# Patient Record
Sex: Male | Born: 1955 | Race: White | Hispanic: No | Marital: Single | Smoking: Current every day smoker
Health system: Southern US, Community
[De-identification: ages and names within clinical notes are randomized; demographics above are authoritative.]

## PROBLEM LIST (undated history)

## (undated) DIAGNOSIS — Z8611 Personal history of tuberculosis: Secondary | ICD-10-CM

## (undated) DIAGNOSIS — J449 Chronic obstructive pulmonary disease, unspecified: Secondary | ICD-10-CM

## (undated) DIAGNOSIS — I10 Essential (primary) hypertension: Secondary | ICD-10-CM

## (undated) DIAGNOSIS — F200 Paranoid schizophrenia: Secondary | ICD-10-CM

## (undated) DIAGNOSIS — E785 Hyperlipidemia, unspecified: Secondary | ICD-10-CM

## (undated) DIAGNOSIS — K219 Gastro-esophageal reflux disease without esophagitis: Secondary | ICD-10-CM

## (undated) DIAGNOSIS — F329 Major depressive disorder, single episode, unspecified: Secondary | ICD-10-CM

---

## 2008-07-05 ENCOUNTER — Ambulatory Visit: Payer: Self-pay | Admitting: Gastroenterology

## 2008-07-12 ENCOUNTER — Ambulatory Visit (HOSPITAL_COMMUNITY): Admission: RE | Admit: 2008-07-12 | Discharge: 2008-07-12 | Payer: Self-pay | Admitting: Gastroenterology

## 2008-07-12 ENCOUNTER — Encounter: Payer: Self-pay | Admitting: Gastroenterology

## 2008-07-12 ENCOUNTER — Ambulatory Visit: Payer: Self-pay | Admitting: Gastroenterology

## 2008-07-31 ENCOUNTER — Encounter: Payer: Self-pay | Admitting: Gastroenterology

## 2008-09-13 DIAGNOSIS — K625 Hemorrhage of anus and rectum: Secondary | ICD-10-CM

## 2008-09-13 DIAGNOSIS — I10 Essential (primary) hypertension: Secondary | ICD-10-CM | POA: Insufficient documentation

## 2008-09-13 DIAGNOSIS — E785 Hyperlipidemia, unspecified: Secondary | ICD-10-CM | POA: Insufficient documentation

## 2008-09-14 ENCOUNTER — Ambulatory Visit: Payer: Self-pay | Admitting: Gastroenterology

## 2008-09-14 DIAGNOSIS — K649 Unspecified hemorrhoids: Secondary | ICD-10-CM | POA: Insufficient documentation
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
## 2010-09-30 NOTE — Op Note (Signed)
NAME:  Peter Harris, Peter Harris                ACCOUNT NO.:  [id number]   MEDICAL RECORD NO.:  [id number]          PATIENT TYPE:  AMB   LOCATION:  DAY                           FACILITY:  [facility]   PHYSICIAN:  Kassie Mends, M.D.      DATE OF BIRTH:  1956/05/11   DATE OF PROCEDURE:  07/12/2008  DATE OF DISCHARGE:                               OPERATIVE REPORT   REFERRING PHYSICIAN:  Santiago Bur. Shelva Majestic MD, [place], [place].   PRIMARY PHYSICIAN:  Reynolds Bowl, MD   PROCEDURE:  Colonoscopy with cold forceps biopsy of the anal canal and  cold forceps polypectomy.   INDICATION FOR EXAMINATION:  Peter Harris is a 55 year old man who presents  with rectal bleeding.   FINDINGS:  1. A 3-mm sessile hepatic flexure polyp removed via cold forceps.      Otherwise, no evidence of masses, inflammatory changes,      diverticula, or AVMs.  2. Extremely redundant sigmoid colon and tortuous colon.  It made      advancing the scope through the sigmoid colon into the cecum      challenging.  3. Retroflexed view of the rectum was performed and diagnostic      gastroscope because the view could not be obtained with the adult      colonoscope.  The retroflexed view of the rectum revealed an anal      canal lesion.  This is most likely hemorrhoid.  Biopsy was obtained      via cold forceps.  No rectal polyps.   DIAGNOSES:  1. Small hepatic flexure polyp.  2. Anal canal lesion likely secondary to prolapsed internal      hemorrhoids.   RECOMMENDATIONS:  1. Screening colonoscopy in 10 years if he has a simple adenoma.  2. We will call Peter Harris with results of his biopsies.  Add Anusol HC      1 per rectum twice daily for 14 days.  3. He should avoid aspirin or any anti-inflammatory drugs for 21 days.      No anticoagulation for 7 days.  4. He should follow high-fiber diet.  He is given a handout on high-      fiber diet, polyps, and hemorrhoids.   MEDICATIONS:  1. Demerol 100 mg IV.  2. Versed 4 mg IV.   PROCEDURE TECHNIQUE:  Physical exam was performed.  Informed consent was  obtained from the patient after explaining the benefits, risks, and  alternatives to the procedure.  The patient was connected to the monitor  and placed in left lateral position.  Continuous oxygen was provided by  nasal cannula.  IV medicine administered through an indwelling cannula.  After administration of sedation and rectal exam, the patient's rectum  was intubated and the scope was advanced under direct visualization to  the cecum.  The scope was removed slowly by carefully  examining the color, texture, anatomy, and integrity of the mucosa on  the way out.  The patient was recovered in endoscopy and discharged home  in satisfactory condition.   PATH:  Benign colonic mucosa with lymphoid  aggregates. Benign anal canal  lesion.      Kassie Mends, M.D.  Electronically Signed     SM/MEDQ  D:  07/12/2008  T:  07/13/2008  Job:  [id number]   cc:   Reynolds Bowl, MD

## 2018-05-10 ENCOUNTER — Encounter: Payer: Self-pay | Admitting: Gastroenterology

## 2020-12-05 ENCOUNTER — Encounter: Payer: Self-pay | Admitting: Internal Medicine

## 2020-12-05 ENCOUNTER — Ambulatory Visit (HOSPITAL_COMMUNITY)
Admission: RE | Admit: 2020-12-05 | Discharge: 2020-12-05 | Disposition: A | Discharge status: Home / Self Care | Payer: Medicare Other | Source: Ambulatory Visit | Attending: Internal Medicine | Admitting: Internal Medicine

## 2020-12-05 ENCOUNTER — Other Ambulatory Visit: Payer: Self-pay

## 2020-12-05 ENCOUNTER — Ambulatory Visit (INDEPENDENT_AMBULATORY_CARE_PROVIDER_SITE_OTHER): Payer: Medicare Other | Admitting: Internal Medicine

## 2020-12-05 DIAGNOSIS — F17210 Nicotine dependence, cigarettes, uncomplicated: Secondary | ICD-10-CM | POA: Diagnosis not present

## 2020-12-05 DIAGNOSIS — J449 Chronic obstructive pulmonary disease, unspecified: Secondary | ICD-10-CM | POA: Insufficient documentation

## 2020-12-05 MED ORDER — ANORO ELLIPTA 62.5-25 MCG/INH IN AEPB: INHALATION_SPRAY | RESPIRATORY_TRACT | 11 refills | Status: DC

## 2020-12-05 MED ORDER — ANORO ELLIPTA 62.5-25 MCG/INH IN AEPB: 1.0000 | INHALATION_SPRAY | Freq: Every day | RESPIRATORY_TRACT | 0 refills | Status: DC

## 2020-12-05 NOTE — Patient Instructions (Addendum)
Try off spiriva and on Anoro one click first thing each am   Only use your albuterol as a rescue medication to be used if you can't catch your breath by resting or doing a relaxed purse lip breathing pattern.  - The less you use it, the better it will work when you need it. - Ok to use up to 2 puffs  every 4 hours if you must but call for immediate appointment if use goes up over your usual need - Don't leave home without it !!  (think of it like starter fluid for an engine)   If doing better on samples of Anoro, ok to fill the prescription given today  The key is to stop smoking completely before smoking completely stops you!    Please remember to go to the  x-ray department  @  [facility] for your tests - we will call you with the results when they are available     Please schedule a follow up visit in 3 months  with pfts on return - try not to use the  inhalers than morning but bring them with you

## 2020-12-05 NOTE — Assessment & Plan Note (Addendum)
Onset prior to 2020  -  12/05/2020   Walked RA  fast pace for 3 laps without stopping. He did not complain of shortness of breath or trouble breathing during the walk with sats 98% at end. - 12/05/2020  After extensive coaching inhaler device,  effectiveness =    90%  DPI  > change spiriva to anoro daily   Pt is Group B in terms of symptom/risk and laba/lama therefore appropriate rx at this point >>>  anoro plus appropriate saba  Res saba: I spent extra time with pt today reviewing appropriate use of albuterol for prn use on exertion with the following points: 1) saba is for relief of sob that does not improve by walking a slower pace or resting but rather if the pt does not improve after trying this first. 2) If the pt is convinced, as many are, that saba helps recover from activity faster then it's easy to tell if this is the case by re-challenging : ie stop, take the inhaler, then p 5 minutes try the exact same activity (intensity of workload) that just caused the symptoms and see if they are substantially diminished or not after saba 3) if there is an activity that reproducibly causes the symptoms, try the saba 15 min before the activity on alternate days   If in fact the saba really does help, then fine to continue to use it prn but advised may need to look closer at the maintenance regimen being used to achieve better control of airways disease with exertion.     Each maintenance medication was reviewed in detail including emphasizing most importantly the difference between maintenance and prns and under what circumstances the prns are to be triggered using an action plan format where appropriate.  Total time for H and P, chart review, counseling, reviewing elipta device(s) , directly observing portions of ambulatory 02 saturation study/ and generating customized AVS unique to this office visit / same day charting = 54 min

## 2020-12-05 NOTE — Progress Notes (Signed)
Peter Harris, male    DOB: [birth year] MRN: [id number]   Brief patient profile:  65 yo  male active smoker who lives in group home referred to pulmonary clinic in [place]  12/05/2020 by   Anselm Jungling NP for doe x 48ft end of driveway level onset spring 2022 though placed on spiriva and albuterol since at least 2020 (not really sure)      History of Present Illness  12/05/2020  Pulmonary/ 1st office eval/ Demarquez Ciolek / [place] Office  Chief Complaint  Patient presents with   Consult    Patient reports that he has had short of breath for years and worse in last 6 months.   Dyspnea:  baseline not very active but able to walk a mall  Cough: none  Sleep: no problem on side / flat bed  SABA use: saba first thing in am / occ in pm  No obvious day to day or daytime variability or assoc excess/ purulent sputum or mucus plugs or hemoptysis or cp or chest tightness, subjective wheeze or overt sinus or hb symptoms.   Sleeping  without nocturnal  or early am exacerbation  of respiratory  c/o's or need for noct saba. Also denies any obvious fluctuation of symptoms with weather or environmental changes or other aggravating or alleviating factors except as outlined above   No unusual exposure hx or h/o childhood pna/ asthma or knowledge of premature birth.  Current Allergies, Complete Past Medical History, Past Surgical History, Family History, and Social History were reviewed in [employer] record.  ROS  The following are not active complaints unless bolded Hoarseness, sore throat, dysphagia, dental problems, itching, sneezing,  nasal congestion or discharge of excess mucus or purulent secretions, ear ache,   fever, chills, sweats, unintended wt loss or wt gain, classically pleuritic or exertional cp,  orthopnea pnd or arm/hand swelling  or leg swelling, presyncope, palpitations, abdominal pain, anorexia, nausea, vomiting, diarrhea  or change in bowel habits or change in  bladder habits, change in stools or change in urine, dysuria, hematuria,  rash, arthralgias, visual complaints, headache, numbness, weakness or ataxia or problems with walking or coordination,  change in mood or  memory.           No past medical history on file.  Outpatient Medications Prior to Visit  Medication Sig Dispense Refill   albuterol (VENTOLIN HFA) 108 (90 Base) MCG/ACT inhaler Inhale 1 puff into the lungs.     aspirin 81 MG EC tablet Take 1 tablet by mouth daily.     hydrALAZINE (APRESOLINE) 10 MG tablet Take 10 mg by mouth 3 (three) times daily.     hydrOXYzine (ATARAX/VISTARIL) 50 MG tablet Take 1 tablet by mouth at bedtime.     loratadine (CLARITIN) 10 MG tablet Take 1 tablet by mouth daily.     lovastatin (MEVACOR) 40 MG tablet Take 2 tablets by mouth at bedtime.     metoprolol (TOPROL-XL) 200 MG 24 hr tablet Take 200 mg by mouth daily.     Multiple Vitamin (MULTI-VITAMIN) tablet Take 1 tablet by mouth daily.     OLANZapine (ZYPREXA) 20 MG tablet Take 20 mg by mouth daily.     omeprazole (PRILOSEC) 40 MG capsule Take 40 mg by mouth daily.     sertraline (ZOLOFT) 25 MG tablet Take 25 mg by mouth daily.     triamterene-hydrochlorothiazide (MAXZIDE) 75-50 MG tablet Take 1 tablet by mouth daily.     SPIRIVA HANDIHALER 18  MCG inhalation capsule 1 capsule daily.     No facility-administered medications prior to visit.     Objective:     BP 118/74 (BP Location: Left Arm, Patient Position: Sitting, Cuff Size: Normal)   Pulse 75   Temp 97.7 F (36.5 C) (Oral)   Ht 5\' 9"  (1.753 m)   Wt 136 lb (61.7 kg)   SpO2 98% Comment: RA  BMI 20.08 kg/m   SpO2: 98 % (RA)  Pleasant very thin amb bm nad   HEENT : pt wearing mask not removed for exam due to covid - 19 concerns.    NECK :  without JVD/Nodes/TM/ nl carotid upstrokes bilaterally   LUNGS: no acc muscle use,  Mild barrel  contour chest wall with bilateral  Distant bs s audible wheeze and  without cough on insp or  exp maneuvers  and mild  Hyperresonant  to  percussion bilaterally     CV:  RRR  no s3 or murmur or increase in P2, and no edema   ABD:  soft and nontender with pos end  insp Hoover's  in the supine position. No bruits or organomegaly appreciated, bowel sounds nl  MS:   Nl gait/  ext warm without deformities, calf tenderness, cyanosis or clubbing No obvious joint restrictions   SKIN: warm and dry without lesions    NEURO:  alert, approp, nl sensorium with  no motor or cerebellar deficits apparent.        CXR PA and Lateral:   12/05/2020 :    I personally reviewed images and agree with radiology impression as follows:    1.  No radiographic evidence of acute cardiopulmonary disease. 2. Emphysema.     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 He did not complain of shortness of breath or trouble breathing during the walk with sats 98% at end. - 12/05/2020  After extensive coaching inhaler device,  effectiveness =    90%  DPI  > change spiriva to anoro daily   Pt is Group B in terms of symptom/risk and laba/lama therefore appropriate rx at this point >>>  anoro plus appropriate saba  Res saba: I spent extra time with pt today reviewing appropriate use of albuterol for prn use on exertion with the following points: 1) saba is for relief of sob that does not improve by walking a slower pace or resting but rather if the pt does not improve after trying this first. 2) If the pt is convinced, as many are, that saba helps recover from activity faster then it's easy to tell if this is the case by re-challenging : ie stop, take the inhaler, then p 5 minutes try the exact same activity (intensity of workload) that just caused the symptoms and see if they are substantially diminished or not after saba 3) if there is an activity that reproducibly causes the symptoms, try the saba 15 min before the activity on  alternate days   If in fact the saba really does help, then fine to continue to use it prn but advised may need to look closer at the maintenance regimen being used to achieve better control of airways disease with exertion.     Each maintenance medication was reviewed in detail including emphasizing most importantly the difference between maintenance and prns and under what circumstances the prns are to be  triggered using an action plan format where appropriate.  Total time for H and P, chart review, counseling, reviewing elipta device(s) , directly observing portions of ambulatory 02 saturation study/ and generating customized AVS unique to this office visit / same day charting = 54 min                  Cigarette smoker 4-5 min discussion re active cigarette smoking in addition to office E&M  Ask about tobacco use:  Ongoing Advise quitting   I took an extended  opportunity with this patient to outline the consequences of continued cigarette use  in airway disorders based on all the data we have from the multiple national lung health studies (perfomed over decades at millions of dollars in cost)  indicating that smoking cessation, not choice of inhalers or physicians, is the most important aspect of his care.  Assess willingness:  Not committed at this point Assist in quit attempt:  Per PCP when ready Arrange follow up:   Follow up per Primary Care planned  For smoking cessation classes call [phone]                                 Sandrea Hughs, MD 12/05/2020

## 2020-12-05 NOTE — Assessment & Plan Note (Addendum)
4-5 min discussion re active cigarette smoking in addition to office E&M  Ask about tobacco use:   Ongoing  Advise quitting   I took an extended  opportunity with this patient to outline the consequences of continued cigarette use  in airway disorders based on all the data we have from the multiple national lung health studies (perfomed over decades at millions of dollars in cost)  indicating that smoking cessation, not choice of inhalers or physicians, is the most important aspect of his care.   Assess willingness:  Not committed at this point Assist in quit attempt:  Per PCP when ready Arrange follow up:   Follow up per Primary Care planned  For smoking cessation classes call [phone]      

## 2020-12-13 ENCOUNTER — Telehealth: Payer: Self-pay | Admitting: Internal Medicine

## 2020-12-13 NOTE — Telephone Encounter (Signed)
ATC Barbara about inhaler for patient, [facility]

## 2020-12-13 NOTE — Addendum Note (Signed)
Addended by: Pilar Grammes on: 12/13/2020 10:14 AM   Modules accepted: Orders

## 2020-12-16 NOTE — Telephone Encounter (Signed)
Called and spoke with Britta Mccreedy who states that patient was given Anoro at last visit and would like to switch to that inhaler. Britta Mccreedy states that she was given a prescription for it. Advised her to take it to pharmacy and they should be able to fill it for her and if there is any issues for her to call and let us know. She stated he is also scheduled for his PFT and then will come back in October. Nothing further needed at this time.

## 2020-12-16 NOTE — Telephone Encounter (Signed)
Peter Harris from [facility] returning call.  [phone].

## 2021-02-14 ENCOUNTER — Other Ambulatory Visit (HOSPITAL_COMMUNITY)
Admission: RE | Admit: 2021-02-14 | Discharge: 2021-02-14 | Disposition: A | Discharge status: Home / Self Care | Payer: Medicare Other | Source: Ambulatory Visit | Attending: Internal Medicine | Admitting: Internal Medicine

## 2021-02-18 ENCOUNTER — Ambulatory Visit (HOSPITAL_COMMUNITY): Payer: Medicare Other

## 2021-02-27 ENCOUNTER — Ambulatory Visit: Payer: Medicare Other | Admitting: Internal Medicine

## 2021-02-27 NOTE — Progress Notes (Deleted)
Peter Harris, male    DOB: 02-03-1956 MRN: [id number]   Brief patient profile:  65 yo  male active smoker who lives in group home referred to pulmonary clinic in [place]  12/05/2020 by   Anselm Jungling NP for doe x 41ft end of driveway level onset spring 2022 though placed on spiriva and albuterol since at least 2020 (not really sure)      History of Present Illness  12/05/2020  Pulmonary/ 1st office eval/ Latrica Clowers / [place] Office  Chief Complaint  Patient presents with   Consult    Patient reports that he has had short of breath for years and worse in last 6 months.   Dyspnea:  baseline not very active but able to walk a mall  Cough: none  Sleep: no problem on side / flat bed  SABA use: saba first thing in am / occ in pm Rec Try off spiriva and on Anoro one click first thing each am  Only use your albuterol as a rescue medication If doing better on samples of Anoro, ok to fill the prescription given today The key is to stop smoking completely before smoking completely stops you!  Please schedule a follow up visit in 3 months  with pfts on return - try not to use the  inhalers than morning but bring them with you   02/27/2021  f/u ov/[place] office/Korie Brabson re: copd gold ?  maint on ***  No chief complaint on file.   Dyspnea:  *** Cough: *** Sleeping: *** SABA use: *** 02: *** Covid status: *** Lung cancer screening: ***   No obvious day to day or daytime variability or assoc excess/ purulent sputum or mucus plugs or hemoptysis or cp or chest tightness, subjective wheeze or overt sinus or hb symptoms.   *** without nocturnal  or early am exacerbation  of respiratory  c/o's or need for noct saba. Also denies any obvious fluctuation of symptoms with weather or environmental changes or other aggravating or alleviating factors except as outlined above   No unusual exposure hx or h/o childhood pna/ asthma or knowledge of premature birth.  Current Allergies, Complete Past  Medical History, Past Surgical History, Family History, and Social History were reviewed in [employer] record.  ROS  The following are not active complaints unless bolded Hoarseness, sore throat, dysphagia, dental problems, itching, sneezing,  nasal congestion or discharge of excess mucus or purulent secretions, ear ache,   fever, chills, sweats, unintended wt loss or wt gain, classically pleuritic or exertional cp,  orthopnea pnd or arm/hand swelling  or leg swelling, presyncope, palpitations, abdominal pain, anorexia, nausea, vomiting, diarrhea  or change in bowel habits or change in bladder habits, change in stools or change in urine, dysuria, hematuria,  rash, arthralgias, visual complaints, headache, numbness, weakness or ataxia or problems with walking or coordination,  change in mood or  memory.        No outpatient medications have been marked as taking for the 02/27/21 encounter (Appointment) with Nyoka Cowden, MD.            Objective:     Wt Readings from Last 3 Encounters:  12/05/20 136 lb (61.7 kg)      Vital signs reviewed  02/27/2021  - Note at rest 02 sats  ***% on ***   General appearance:    ***        Mild bar***     I personally reviewed images and agree  with radiology impression as follows:  CXR:   12/05/20 pa and lat C/w emphysema     Assessment

## 2021-03-18 DEATH — deceased

## 2021-03-21 ENCOUNTER — Other Ambulatory Visit (HOSPITAL_COMMUNITY)
Admission: RE | Admit: 2021-03-21 | Discharge: 2021-03-21 | Disposition: A | Discharge status: Home / Self Care | Payer: Medicare Other | Source: Ambulatory Visit | Attending: Internal Medicine | Admitting: Internal Medicine

## 2021-03-21 DIAGNOSIS — Z01818 Encounter for other preprocedural examination: Secondary | ICD-10-CM

## 2021-03-21 DIAGNOSIS — Z01812 Encounter for preprocedural laboratory examination: Secondary | ICD-10-CM | POA: Insufficient documentation

## 2021-03-21 DIAGNOSIS — Z20822 Contact with and (suspected) exposure to covid-19: Secondary | ICD-10-CM | POA: Diagnosis not present

## 2021-03-21 LAB — SARS CORONAVIRUS 2 (TAT 6-24 HRS): SARS Coronavirus 2: NEGATIVE

## 2021-03-25 ENCOUNTER — Ambulatory Visit (HOSPITAL_COMMUNITY)
Admission: RE | Admit: 2021-03-25 | Discharge: 2021-03-25 | Disposition: A | Discharge status: Home / Self Care | Payer: Medicare Other | Source: Ambulatory Visit | Attending: Internal Medicine | Admitting: Internal Medicine

## 2021-03-25 ENCOUNTER — Other Ambulatory Visit: Payer: Self-pay

## 2021-03-25 DIAGNOSIS — J449 Chronic obstructive pulmonary disease, unspecified: Secondary | ICD-10-CM | POA: Diagnosis present

## 2021-03-25 LAB — PULMONARY FUNCTION TEST
DL/VA % pred: 31 %
DL/VA: 1.3 ml/min/mmHg/L
DLCO unc % pred: 22 %
DLCO unc: 6.27 ml/min/mmHg
FEF 25-75 Post: 0.42 L/sec
FEF 25-75 Pre: 0.49 L/sec
FEF2575-%Change-Post: -13 %
FEF2575-%Pred-Post: 15 %
FEF2575-%Pred-Pre: 17 %
FEV1-%Change-Post: -5 %
FEV1-%Pred-Post: 34 %
FEV1-%Pred-Pre: 36 %
FEV1-Post: 1.22 L
FEV1-Pre: 1.3 L
FEV1FVC-%Change-Post: -8 %
FEV1FVC-%Pred-Pre: 71 %
FEV6-%Change-Post: 0 %
FEV6-%Pred-Post: 51 %
FEV6-%Pred-Pre: 51 %
FEV6-Post: 2.31 L
FEV6-Pre: 2.3 L
FEV6FVC-%Change-Post: -2 %
FEV6FVC-%Pred-Post: 97 %
FEV6FVC-%Pred-Pre: 99 %
FVC-%Change-Post: 2 %
FVC-%Pred-Post: 52 %
FVC-%Pred-Pre: 51 %
FVC-Post: 2.5 L
FVC-Pre: 2.43 L
Post FEV1/FVC ratio: 49 %
Post FEV6/FVC ratio: 92 %
Pre FEV1/FVC ratio: 53 %
Pre FEV6/FVC Ratio: 95 %
RV % pred: 365 %
RV: 8.85 L
TLC % pred: 160 %
TLC: 11.57 L

## 2021-03-25 MED ORDER — ALBUTEROL SULFATE (2.5 MG/3ML) 0.083% IN NEBU
2.5000 mg | INHALATION_SOLUTION | Freq: Once | RESPIRATORY_TRACT | Status: AC
  Administered 2021-03-25: 2.5 mg via RESPIRATORY_TRACT

## 2021-03-28 ENCOUNTER — Telehealth: Payer: Self-pay

## 2021-03-28 NOTE — Telephone Encounter (Signed)
ATC patient. Was connected with receptionist through number on file. Asked her to get patient or Peter Harris or Peter Harris (per [facility]) to call back to go over results. Gave her [facility] office number

## 2021-03-28 NOTE — Telephone Encounter (Signed)
-----   Message from Nyoka Cowden, MD sent at 03/27/2021  5:28 AM EST ----- Let him know pfts show mod severe copd, no change in recs for now but Be sure patient has/keeps f/u ov so we can go over all the details of this study and get a plan together moving forward - ok to move up f/u if not feeling better and wants to be seen sooner

## 2021-04-15 ENCOUNTER — Encounter: Payer: Self-pay | Admitting: Internal Medicine

## 2021-04-15 ENCOUNTER — Other Ambulatory Visit: Payer: Self-pay

## 2021-04-15 ENCOUNTER — Ambulatory Visit (INDEPENDENT_AMBULATORY_CARE_PROVIDER_SITE_OTHER): Payer: Medicare Other | Admitting: Internal Medicine

## 2021-04-15 VITALS — BP 134/84 | HR 82 | Temp 98.4°F | Ht 71.0 in | Wt 136.1 lb

## 2021-04-15 DIAGNOSIS — J449 Chronic obstructive pulmonary disease, unspecified: Secondary | ICD-10-CM | POA: Diagnosis not present

## 2021-04-15 DIAGNOSIS — F17210 Nicotine dependence, cigarettes, uncomplicated: Secondary | ICD-10-CM | POA: Diagnosis not present

## 2021-04-15 NOTE — Patient Instructions (Signed)
You have moderately severe copd which will continue to get worse as long as you continue to smoke.  I am referring you for lung cancer screening   The key is to stop smoking completely before smoking completely stops you!   Please schedule a follow up visit in 6 months but call sooner if needed

## 2021-04-15 NOTE — Progress Notes (Signed)
Peter Harris, male    DOB: [birth year]   MRN: [id number]   Brief patient profile:  65 yo bm   active smoker who lives in group home referred to pulmonary clinic in [place]  12/05/2020 by   Peter Grill NP for doe x 41ft end of driveway level onset spring 2022 though placed on spiriva and albuterol since at least 2020 (not really sure)      History of Present Illness  12/05/2020  Pulmonary/ 1st office eval/ Peter Harris / [place] [facility]  Chief Complaint  Patient presents with   Consult    Patient reports that he has had short of breath for years and worse in last 6 months.   Dyspnea:  baseline not very active but able to walk a mall  Cough: none  Sleep: no problem on side / flat bed  SABA use: saba first thing in am / occ in pm Rec Try off spiriva and on Anoro one click first thing each am  Only use your albuterol as a rescue medication to be used if you can't catch your breath  If doing better on samples of Anoro, ok to fill the prescription given today The key is to stop smoking completely before smoking completely stops you!     04/15/2021  f/u ov/[place] office/Peter Harris re: GOLD 3 COPD  maint on Anoro  Chief Complaint  Patient presents with   Follow-up    Feels SOB has improved since last OV.   Dyspnea:  walking end of driveway and back easier x multiple trips now Cough: min am mucoid Sleeping: flat bed/ 2 pillows  SABA use: none  02: none  Covid status: x 3  Lung cancer screening: referrd 04/15/2021    No obvious day to day or daytime variability or assoc excess/ purulent sputum or mucus plugs or hemoptysis or cp or chest tightness, subjective wheeze or overt sinus or hb symptoms.   Sleeping  without nocturnal  or early am exacerbation  of respiratory  c/o's or need for noct saba. Also denies any obvious fluctuation of symptoms with weather or environmental changes or other aggravating or alleviating factors except as outlined above   No unusual exposure hx or h/o  childhood pna/ asthma or knowledge of premature birth.  Current Allergies, Complete Past Medical History, Past Surgical History, Family History, and Social History were reviewed in [employer] record.  ROS  The following are not active complaints unless bolded Hoarseness, sore throat, dysphagia, dental problems, itching, sneezing,  nasal congestion or discharge of excess mucus or purulent secretions, ear ache,   fever, chills, sweats, unintended wt loss or wt gain, classically pleuritic or exertional cp,  orthopnea pnd or arm/hand swelling  or leg swelling, presyncope, palpitations, abdominal pain, anorexia, nausea, vomiting, diarrhea  or change in bowel habits or change in bladder habits, change in stools or change in urine, dysuria, hematuria,  rash, arthralgias, visual complaints, headache, numbness, weakness or ataxia or problems with walking or coordination,  change in mood or  memory.        Current Meds  Medication Sig   albuterol (VENTOLIN HFA) 108 (90 Base) MCG/ACT inhaler Inhale 1 puff into the lungs.   aspirin 81 MG EC tablet Take 1 tablet by mouth daily.   hydrALAZINE (APRESOLINE) 10 MG tablet Take 10 mg by mouth 3 (three) times daily.   hydrOXYzine (ATARAX/VISTARIL) 50 MG tablet Take 1 tablet by mouth at bedtime.   loratadine (CLARITIN) 10 MG tablet Take  1 tablet by mouth daily.   lovastatin (MEVACOR) 40 MG tablet Take 2 tablets by mouth at bedtime.   metoprolol (TOPROL-XL) 200 MG 24 hr tablet Take 200 mg by mouth daily.   Multiple Vitamin (MULTI-VITAMIN) tablet Take 1 tablet by mouth daily.   OLANZapine (ZYPREXA) 20 MG tablet Take 20 mg by mouth daily.   omeprazole (PRILOSEC) 40 MG capsule Take 40 mg by mouth daily.   sertraline (ZOLOFT) 25 MG tablet Take 25 mg by mouth daily.   triamterene-hydrochlorothiazide (MAXZIDE) 75-50 MG tablet Take 1 tablet by mouth daily.   umeclidinium-vilanterol (ANORO ELLIPTA) 62.5-25 MCG/INH AEPB One click each am    umeclidinium-vilanterol (ANORO ELLIPTA) 62.5-25 MCG/INH AEPB Inhale 1 puff into the lungs daily.   umeclidinium-vilanterol (ANORO ELLIPTA) 62.5-25 MCG/INH AEPB Inhale 1 puff into the lungs daily.             Objective:       Wt Readings from Last 3 Encounters:  04/15/21 136 lb 1.9 oz (61.7 kg)  12/05/20 136 lb (61.7 kg)    Vital signs reviewed  04/15/2021  - Note at rest 02 sats  98% on RA   General appearance:    amb thin bm nad     HEENT : pt wearing mask not removed for exam due to covid - 19 concerns.    NECK :  without JVD/Nodes/TM/ nl carotid upstrokes bilaterally   LUNGS: no acc muscle use,  Mild barrel  contour chest wall with bilateral  Distant bs s audible wheeze and  without cough on insp or exp maneuvers  and mild  Hyperresonant  to  percussion bilaterally     CV:  RRR  no s3 or murmur or increase in P2, and no edema   ABD:  soft and nontender with pos end  insp Hoover's  in the supine position. No bruits or organomegaly appreciated, bowel sounds nl  MS:   Nl gait/  ext warm without deformities, calf tenderness, cyanosis or clubbing No obvious joint restrictions   SKIN: warm and dry without lesions    NEURO:  alert, approp, nl sensorium with  no motor or cerebellar deficits apparent.          Assessment

## 2021-04-16 ENCOUNTER — Encounter: Payer: Self-pay | Admitting: Internal Medicine

## 2021-04-16 NOTE — Assessment & Plan Note (Addendum)
Onset doe  prior to 2020 with emphysema suggested on cxr 12/05/20  -  12/05/2020   Walked RA  fast pace for 3 laps without stopping. He did not complain of shortness of breath or trouble breathing during the walk with sats 98% at end. - 12/05/2020  After extensive coaching inhaler device,  effectiveness =    90%  DPI  > change spiriva to anoro daily  - PFT's   03/25/21  FEV1 1.30 (36 % ) ratio 0.53  p 0 % improvement from saba p Anoro  prior to study with DLCO  6.27 (22%) corrects to 1.30 (31%)  for alv volume and FV curve classically concave    - The proper method of use, as well as anticipated side effects, of a metered-dose inhaler and elipta inhaler were discussed and demonstrated to the patient using teach back method.   Pt is Group B in terms of symptom/risk and laba/lama therefore appropriate rx at this point >>>  Continue anoro and prn saba   Continue the walking routine to monitor airway status   F/u in 6 m, call sooner if needed         Each maintenance medication was reviewed in detail including emphasizing most importantly the difference between maintenance and prns and under what circumstances the prns are to be triggered using an action plan format where appropriate.  Total time for H and P, chart review, counseling, reviewing dpi/hfa device(s) and generating customized AVS unique to this office visit / same day charting = 22 min

## 2021-04-16 NOTE — Assessment & Plan Note (Signed)
4-5 min discussion re active cigarette smoking in addition to office E&M  Ask about tobacco use:   Ongoing  Advise quitting   I took an extended  opportunity with this patient to outline the consequences of continued cigarette use  in airway disorders based on all the data we have from the multiple national lung health studies (perfomed over decades at millions of dollars in cost)  indicating that smoking cessation, not choice of inhalers or physicians, is the most important aspect of his care.   Assess willingness:  Not committed at this point Assist in quit attempt:  Per PCP when ready Arrange follow up:   Follow up per Primary Care planned   Low-dose CT lung cancer screening is recommended for patients who are 88-51 years of age with a 20+ pack-year history of smoking, and who are currently smoking or quit <=15 years ago. No coughing up blood  No unintentional weight loss of > 15 pounds in the last 6 months  >>> referred for shared decision making

## 2021-08-06 ENCOUNTER — Other Ambulatory Visit: Payer: Self-pay | Admitting: Otolaryngology

## 2021-08-06 DIAGNOSIS — R221 Localized swelling, mass and lump, neck: Secondary | ICD-10-CM

## 2021-08-07 ENCOUNTER — Encounter: Payer: Self-pay | Admitting: Internal Medicine

## 2021-08-12 ENCOUNTER — Ambulatory Visit
Admission: RE | Admit: 2021-08-12 | Discharge: 2021-08-12 | Disposition: A | Discharge status: Home / Self Care | Payer: Medicare Other | Source: Ambulatory Visit | Attending: Otolaryngology | Admitting: Otolaryngology

## 2021-08-12 ENCOUNTER — Other Ambulatory Visit: Payer: Self-pay

## 2021-08-12 DIAGNOSIS — R221 Localized swelling, mass and lump, neck: Secondary | ICD-10-CM

## 2021-08-25 ENCOUNTER — Ambulatory Visit: Payer: Medicare Other | Admitting: Gastroenterology

## 2021-10-14 ENCOUNTER — Ambulatory Visit: Payer: Medicare Other | Admitting: Internal Medicine

## 2021-10-14 NOTE — Progress Notes (Deleted)
Peter Harris, male    DOB: [birth year]   MRN: [id number]   Brief patient profile:  66yo bm   active smoker who lives in group home referred to pulmonary clinic in [place]  12/05/2020 by   Anselm Jungling NP for doe x 82ft end of driveway level onset spring 2022 though placed on spiriva and albuterol since at least 2020 (not really sure)      History of Present Illness  12/05/2020  Pulmonary/ 1st office eval/ French Kendra / [place] [facility]  Chief Complaint  Patient presents with   Consult    Patient reports that he has had short of breath for years and worse in last 6 months.   Dyspnea:  baseline not very active but able to walk a mall  Cough: none  Sleep: no problem on side / flat bed  SABA use: saba first thing in am / occ in pm Rec Try off spiriva and on Anoro one click first thing each am  Only use your albuterol as a rescue medication to be used if you can't catch your breath  If doing better on samples of Anoro, ok to fill the prescription given today The key is to stop smoking completely before smoking completely stops you!     04/15/2021  f/u ov/[place] office/Rhyland Hinderliter re: GOLD 3 COPD  maint on Anoro  Chief Complaint  Patient presents with   Follow-up    Feels SOB has improved since last OV.   Dyspnea:  walking end of driveway and back easier x multiple trips now Cough: min am mucoid Sleeping: flat bed/ 2 pillows  SABA use: none  02: none  Covid status: x 3  Lung cancer screening: referrd 04/15/2021  Rec You have moderately severe copd which will continue to get worse as long as you continue to smoke. referring you for lung cancer screening  The key is to stop smoking completely before smoking completely stops you!     10/14/2021  f/u ov/ office/Fradel Baldonado re: *** maint on ***  No chief complaint on file.   Dyspnea:  *** Cough: *** Sleeping: *** SABA use: *** 02: *** Covid status: *** Lung cancer screening: ***   No obvious day to day or daytime  variability or assoc excess/ purulent sputum or mucus plugs or hemoptysis or cp or chest tightness, subjective wheeze or overt sinus or hb symptoms.   *** without nocturnal  or early am exacerbation  of respiratory  c/o's or need for noct saba. Also denies any obvious fluctuation of symptoms with weather or environmental changes or other aggravating or alleviating factors except as outlined above   No unusual exposure hx or h/o childhood pna/ asthma or knowledge of premature birth.  Current Allergies, Complete Past Medical History, Past Surgical History, Family History, and Social History were reviewed in [employer] record.  ROS  The following are not active complaints unless bolded Hoarseness, sore throat, dysphagia, dental problems, itching, sneezing,  nasal congestion or discharge of excess mucus or purulent secretions, ear ache,   fever, chills, sweats, unintended wt loss or wt gain, classically pleuritic or exertional cp,  orthopnea pnd or arm/hand swelling  or leg swelling, presyncope, palpitations, abdominal pain, anorexia, nausea, vomiting, diarrhea  or change in bowel habits or change in bladder habits, change in stools or change in urine, dysuria, hematuria,  rash, arthralgias, visual complaints, headache, numbness, weakness or ataxia or problems with walking or coordination,  change in mood or  memory.  No outpatient medications have been marked as taking for the 10/14/21 encounter (Appointment) with Nyoka Cowden, MD.              Objective:       10/14/2021        ***  04/15/21 136 lb 1.9 oz (61.7 kg)  12/05/20 136 lb (61.7 kg)    Vital signs reviewed  10/14/2021  - Note at rest 02 sats  ***% on ***   General appearance:    ***     Mild bar***         Assessment

## 2021-11-21 ENCOUNTER — Ambulatory Visit (INDEPENDENT_AMBULATORY_CARE_PROVIDER_SITE_OTHER): Payer: Medicare Other | Admitting: Internal Medicine

## 2021-11-21 ENCOUNTER — Encounter: Payer: Self-pay | Admitting: Internal Medicine

## 2021-11-21 DIAGNOSIS — F17210 Nicotine dependence, cigarettes, uncomplicated: Secondary | ICD-10-CM

## 2021-11-21 DIAGNOSIS — J449 Chronic obstructive pulmonary disease, unspecified: Secondary | ICD-10-CM

## 2021-11-21 NOTE — Assessment & Plan Note (Addendum)
Onset doe  prior to 2020 with emphysema suggested on cxr 12/05/20  -  12/05/2020   Walked RA  fast pace for 3 laps without stopping. He did not complain of shortness of breath or trouble breathing during the walk with sats 98% at end. - 12/05/2020  After extensive coaching inhaler device,  effectiveness =    90%  DPI  > change spiriva to anoro daily  - PFT's   03/25/21  FEV1 1.30 (36 % ) ratio 0.53  p 0 % improvement from saba p Anoro  prior to study with DLCO  6.27 (22%) corrects to 1.30 (31%)  for alv volume and FV curve classically concave   - 11/21/2021   Walked on RA   x  2.5   lap(s) =  approx 375  ft  @ moderately  fast pace, stopped due to sob  with lowest 02 sats 96%  - The proper method of use, as well as anticipated side effects, of a metered-dose inhaler were discussed and demonstrated to the patient using teach back method for dpi/ elipta device and saba  Pt is Group B in terms of symptom/risk and laba/lama therefore appropriate rx at this point >>>  Continue anoro daily and prn saba      Each maintenance medication was reviewed in detail including emphasizing most importantly the difference between maintenance and prns and under what circumstances the prns are to be triggered using an action plan format where appropriate.  Total time for H and P, chart review, counseling, reviewing dpi/hfa device(s) , directly observing portions of ambulatory 02 saturation study/ and generating customized AVS unique to this office visit / same day charting = 21 min       Each maintenance medication was reviewed in detail including emphasizing most importantly the difference between maintenance and prns and under what circumstances the prns are to be triggered using an action plan format where appropriate.  Total time for H and P, chart review, counseling, reviewing dpi/hfa device(s) , directly observing portions of ambulatory 02 saturation study/ and generating customized AVS unique to this office visit /  same day charting = 21 min              Each maintenance medication was reviewed in detail including emphasizing most importantly the difference between maintenance and prns and under what circumstances the prns are to be triggered using an action plan format where appropriate.  Total time for H and P, chart review, counseling, reviewing hfa/dpi device(s) , directly observing portions of ambulatory 02 saturation study/ and generating customized AVS unique to this office visit / same day charting = 21 min

## 2021-11-21 NOTE — Assessment & Plan Note (Addendum)
4-5 min discussion re active cigarette smoking in addition to office E&M  Ask about tobacco use:   ongoing Advise quitting   I took an extended  opportunity with this patient to outline the consequences of continued cigarette use  in airway disorders based on all the data we have from the multiple national lung health studies (perfomed over decades at millions of dollars in cost)  indicating that smoking cessation, not choice of inhalers or physicians, is the most important aspect of care.   Assess willingness:  Says wants to do it on his own when he's really ready Assist in quit attempt: not interested  Arrange follow up:   Follow up per Primary Care planned         Low-dose CT lung cancer screening is recommended for patients who are 40-48 years of age with a 20+ pack-year history of smoking and who are currently smoking or quit <=15 years ago. No coughing up blood  No unintentional weight loss of > 15 pounds in the last 6 months - pt is eligible for scanning yearly until age 71   Discussed in detail all the  indications, usual  risks and alternatives  relative to the benefits with patient who agrees to proceed with w/u as outlined.   Will directly order CT chest as not apparently ble to do the usual  screening program from his assisted living facility

## 2021-11-21 NOTE — Patient Instructions (Signed)
My office will be contacting you by phone for referral for CT chest at [facility]  The key is to stop smoking completely before smoking completely stops you!   No change in medications   Please schedule a follow up visit in  6  months but call sooner if needed

## 2021-11-21 NOTE — Progress Notes (Signed)
Peter Harris, male    DOB: Jan 07, 1956   MRN: [id number]   Brief patient profile:  66 yo bm   active smoker who lives in group home referred to pulmonary clinic in [place]  12/05/2020 by   Peter Jungling NP for doe x 12ft end of driveway level onset spring 2022 though placed on spiriva and albuterol since at least 2020 (not really sure)      History of Present Illness  12/05/2020  Pulmonary/ 1st office eval/ Peter Harris / [place] [facility]  Chief Complaint  Patient presents with   Consult    Patient reports that he has had short of breath for years and worse in last 6 months.   Dyspnea:  baseline not very active but able to walk a mall  Cough: none  Sleep: no problem on side / flat bed  SABA use: saba first thing in am / occ in pm Rec Try off spiriva and on Anoro one click first thing each am  Only use your albuterol as a rescue medication to be used if you can't catch your breath  If doing better on samples of Anoro, ok to fill the prescription given today The key is to stop smoking completely before smoking completely stops you!     04/15/2021  f/u ov/[place] office/Peter Harris re: GOLD 3 COPD  maint on Anoro  Chief Complaint  Patient presents with   Follow-up    Feels SOB has improved since last OV.   Dyspnea:  walking end of driveway and back easier x multiple trips now Cough: min am mucoid Sleeping: flat bed/ 2 pillows  SABA use: none  02: none  Covid status: x 3  Lung cancer screening: referred 04/15/2021  Rec You have moderately severe copd which will continue to get worse as long as you continue to smoke. I am referring you for lung cancer screening >not done as of 11/21/2021  The key is to stop smoking completely before smoking completely stops you! Please schedule a follow up visit in 6 months but call sooner if needed    11/21/2021  f/u ov/[place] office/Peter Harris re: GOLD 3 maint on Anoro each am / still smoking   Chief Complaint  Patient presents with   Follow-up     Breathing doing well. But patient is still smoking   Dyspnea:  no longer walking driveway / walking outside carrying groceries ok  Cough: minimal in am  Sleeping: flat bed/ 2 pillows  SABA use: very rarely  02: none      No obvious day to day or daytime variability or assoc excess/ purulent sputum or mucus plugs or hemoptysis or cp or chest tightness, subjective wheeze or overt sinus or hb symptoms.   Sleeping  without nocturnal  or early am exacerbation  of respiratory  c/o's or need for noct saba. Also denies any obvious fluctuation of symptoms with weather or environmental changes or other aggravating or alleviating factors except as outlined above   No unusual exposure hx or h/o childhood pna/ asthma or knowledge of premature birth.  Current Allergies, Complete Past Medical History, Past Surgical History, Family History, and Social History were reviewed in [employer] record.  ROS  The following are not active complaints unless bolded Hoarseness, sore throat, dysphagia, dental problems, itching, sneezing,  nasal congestion or discharge of excess mucus or purulent secretions, ear ache,   fever, chills, sweats, unintended wt loss or wt gain, classically pleuritic or exertional cp,  orthopnea pnd or  arm/hand swelling  or leg swelling, presyncope, palpitations, abdominal pain, anorexia, nausea, vomiting, diarrhea  or change in bowel habits or change in bladder habits, change in stools or change in urine, dysuria, hematuria,  rash, arthralgias, visual complaints, headache, numbness, weakness or ataxia or problems with walking or coordination,  change in mood or  memory.        Current Meds  Medication Sig   albuterol (VENTOLIN HFA) 108 (90 Base) MCG/ACT inhaler Inhale 1 puff into the lungs.   aspirin 81 MG EC tablet Take 1 tablet by mouth daily.   hydrALAZINE (APRESOLINE) 10 MG tablet Take 10 mg by mouth 3 (three) times daily.   hydrOXYzine (ATARAX/VISTARIL) 50 MG  tablet Take 1 tablet by mouth at bedtime.   loratadine (CLARITIN) 10 MG tablet Take 1 tablet by mouth daily.   lovastatin (MEVACOR) 40 MG tablet Take 2 tablets by mouth at bedtime.   metoprolol (TOPROL-XL) 200 MG 24 hr tablet Take 200 mg by mouth daily.   Multiple Vitamin (MULTI-VITAMIN) tablet Take 1 tablet by mouth daily.   OLANZapine (ZYPREXA) 20 MG tablet Take 20 mg by mouth daily.   omeprazole (PRILOSEC) 40 MG capsule Take 40 mg by mouth daily.   sertraline (ZOLOFT) 25 MG tablet Take 25 mg by mouth daily.   triamterene-hydrochlorothiazide (MAXZIDE) 75-50 MG tablet Take 1 tablet by mouth daily.   umeclidinium-vilanterol (ANORO ELLIPTA) 62.5-25 MCG/INH AEPB One click each am                Objective:    wts   11/21/2021         141   04/15/21 136 lb 1.9 oz (61.7 kg)  12/05/20 136 lb (61.7 kg)     Vital signs reviewed  11/21/2021  - Note at rest 02 sats  97% on RA   General appearance:    thin amb bm > state age   HEENT : Oropharynx  clear/edentulous   Nasal turbinates nl    NECK :  without  apparent JVD/ palpable Nodes/TM    LUNGS: no acc muscle use,  Mild barrel  contour chest wall with bilateral  Distant bs s audible wheeze and  without cough on insp or exp maneuvers  and mild  Hyperresonant  to  percussion bilaterally     CV:  RRR  no s3 or murmur or increase in P2, and no edema   ABD:  soft and nontender with pos end  insp Hoover's  in the supine position.  No bruits or organomegaly appreciated   MS:  Nl gait/ ext warm without deformities Or obvious joint restrictions  calf tenderness, cyanosis or clubbing     SKIN: warm and dry without lesions    NEURO:  alert, approp, nl sensorium with  no motor or cerebellar deficits apparent.      Assessment

## 2021-11-24 ENCOUNTER — Other Ambulatory Visit: Payer: Self-pay | Admitting: Internal Medicine

## 2021-11-26 ENCOUNTER — Ambulatory Visit (HOSPITAL_COMMUNITY)
Admission: RE | Admit: 2021-11-26 | Discharge: 2021-11-26 | Disposition: A | Discharge status: Home / Self Care | Payer: Medicare Other | Source: Ambulatory Visit | Attending: Internal Medicine | Admitting: Internal Medicine

## 2021-11-26 DIAGNOSIS — F17210 Nicotine dependence, cigarettes, uncomplicated: Secondary | ICD-10-CM | POA: Diagnosis present

## 2022-02-09 ENCOUNTER — Telehealth: Payer: Self-pay | Admitting: Internal Medicine

## 2022-02-09 MED ORDER — ANORO ELLIPTA 62.5-25 MCG/ACT IN AEPB: INHALATION_SPRAY | RESPIRATORY_TRACT | 6 refills | Status: DC

## 2022-02-09 NOTE — Telephone Encounter (Signed)
Refill sent to [facility].

## 2022-05-23 IMAGING — DX DG CHEST 2V
2 series · 2 of 2 positions shown · non-contrast
Comparison: None.

CLINICAL DATA: 65-year-old male with history of chronic shortness
of breath increasing over the past 6 months. History of COPD.

EXAM:
CHEST - 2 VIEW

[chest pa]
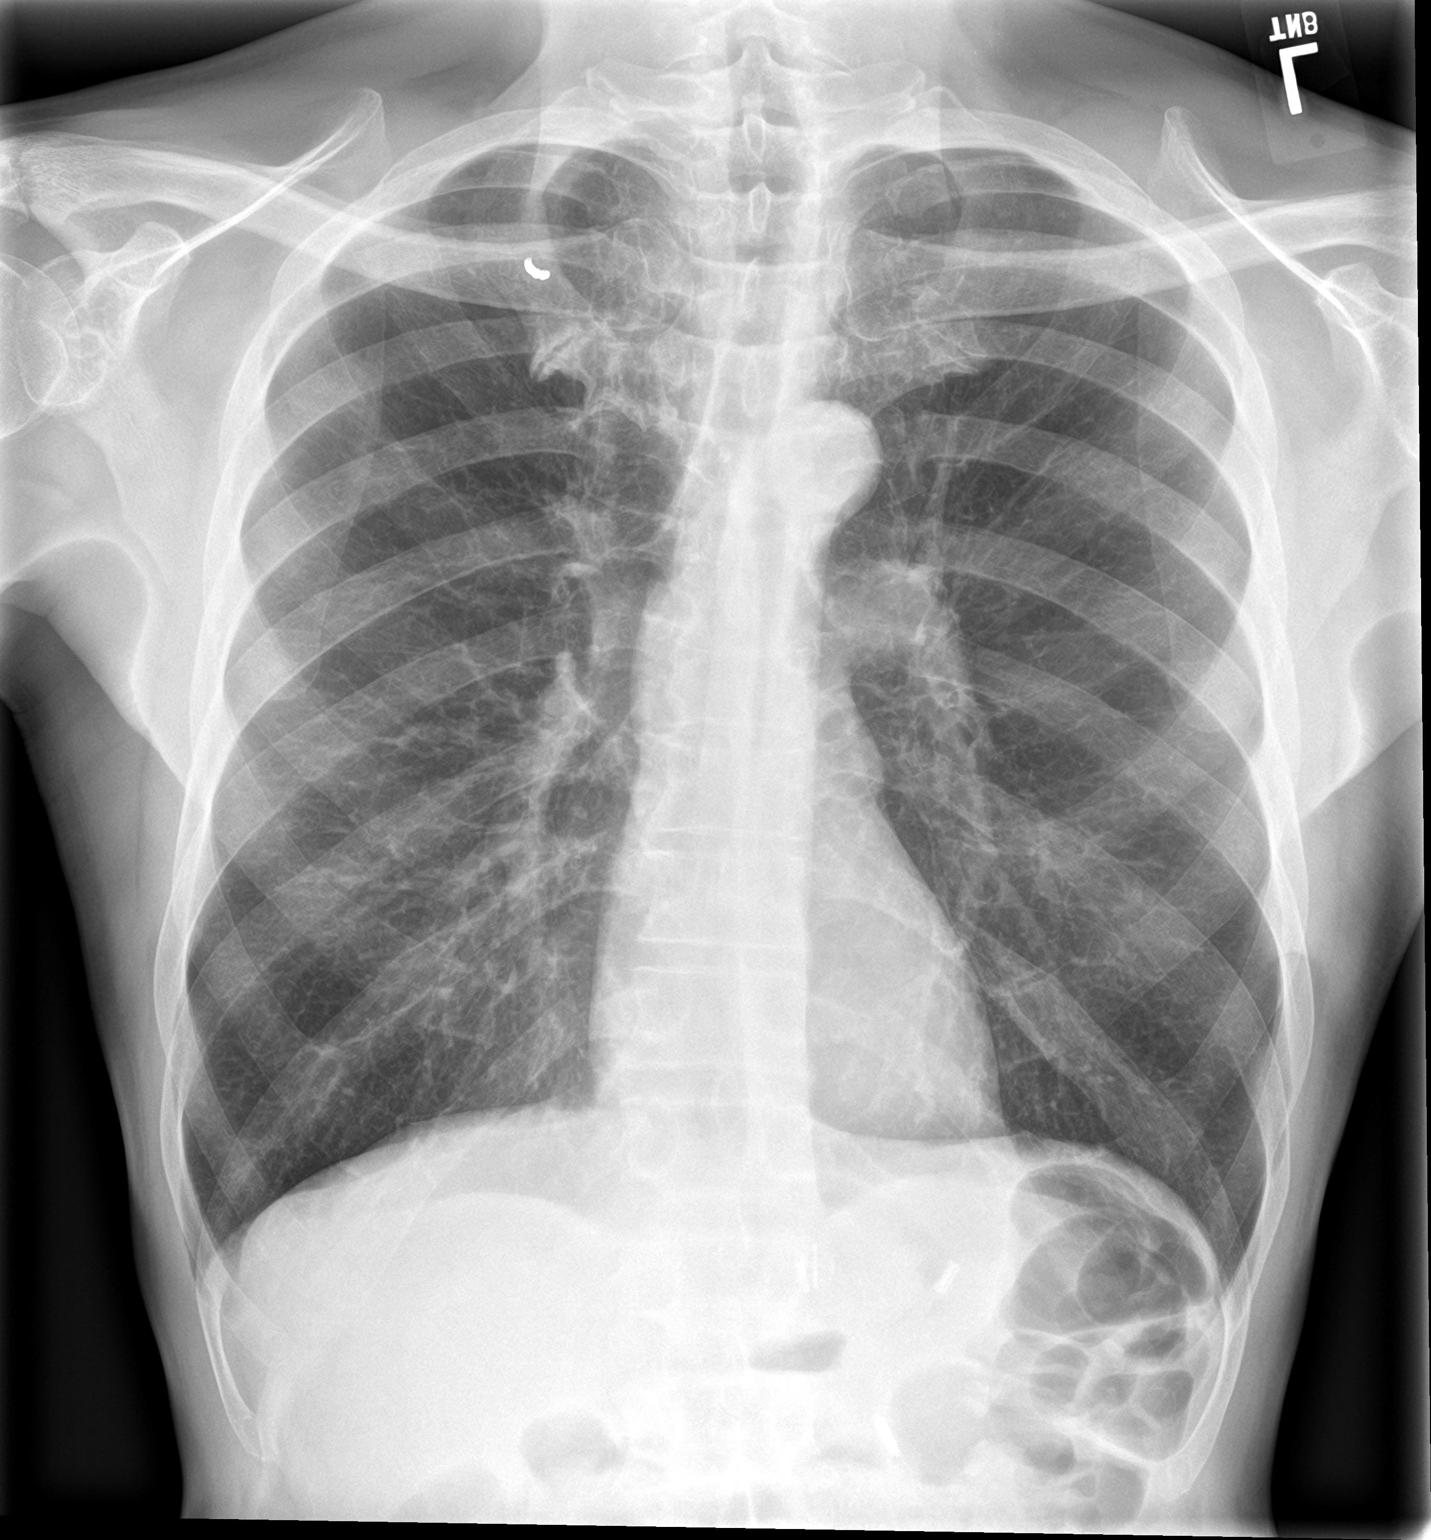

[chest lat]
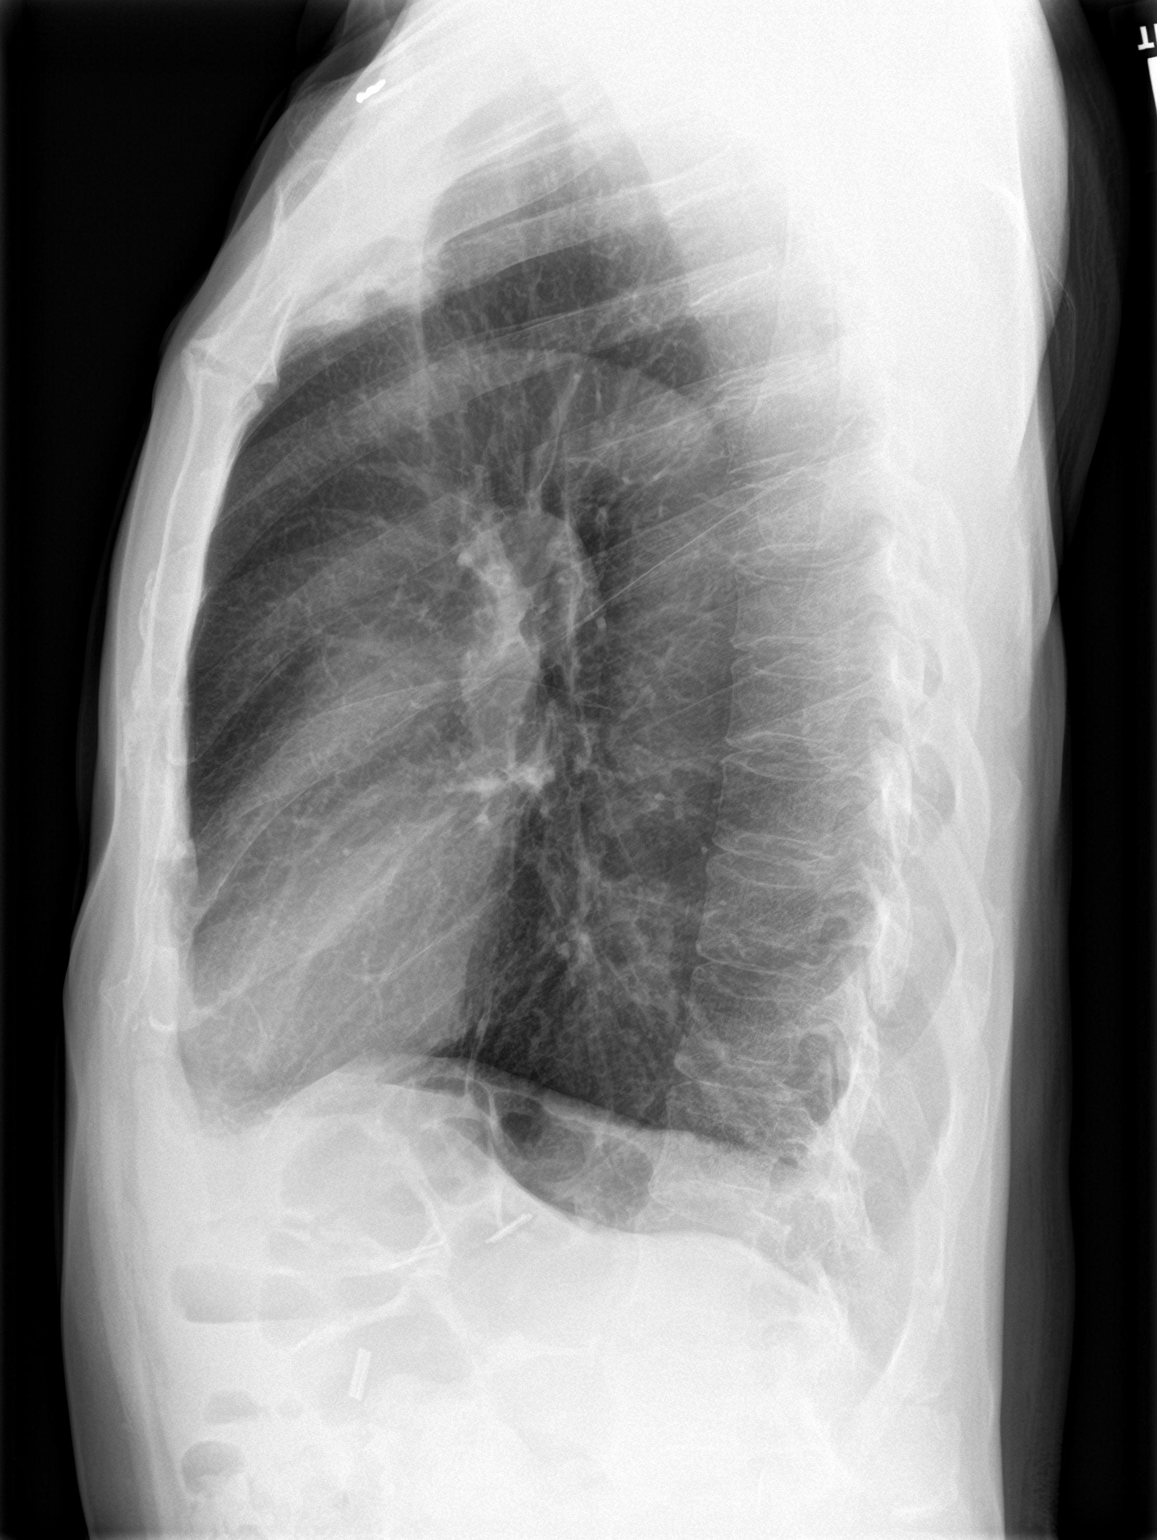

[2 of 2 positions shown; findings below may reference images not displayed]

FINDINGS: Lung volumes are increased with emphysematous changes. No
consolidative airspace disease. No pleural effusions. No
pneumothorax. No pulmonary nodule or mass noted. Pulmonary
vasculature and the cardiomediastinal silhouette are within normal
limits. Metallic density projecting over the medial aspect of the
right clavicle.
IMPRESSION: 1.  No radiographic evidence of acute cardiopulmonary disease.
2. Emphysema.

## 2022-05-31 ENCOUNTER — Emergency Department (HOSPITAL_COMMUNITY): Payer: Medicare Other

## 2022-05-31 ENCOUNTER — Observation Stay (HOSPITAL_COMMUNITY)
Admission: EM | Admit: 2022-05-31 | Discharge: 2022-06-01 | Disposition: A | Discharge status: Home / Self Care | Payer: Medicare Other | Attending: Internal Medicine | Admitting: Internal Medicine

## 2022-05-31 ENCOUNTER — Other Ambulatory Visit: Payer: Self-pay

## 2022-05-31 ENCOUNTER — Encounter (HOSPITAL_COMMUNITY): Payer: Self-pay | Admitting: *Deleted

## 2022-05-31 DIAGNOSIS — N3000 Acute cystitis without hematuria: Secondary | ICD-10-CM

## 2022-05-31 DIAGNOSIS — E1110 Type 2 diabetes mellitus with ketoacidosis without coma: Secondary | ICD-10-CM | POA: Diagnosis not present

## 2022-05-31 DIAGNOSIS — N390 Urinary tract infection, site not specified: Secondary | ICD-10-CM | POA: Insufficient documentation

## 2022-05-31 DIAGNOSIS — Z7982 Long term (current) use of aspirin: Secondary | ICD-10-CM | POA: Insufficient documentation

## 2022-05-31 DIAGNOSIS — N179 Acute kidney failure, unspecified: Secondary | ICD-10-CM | POA: Diagnosis not present

## 2022-05-31 DIAGNOSIS — J449 Chronic obstructive pulmonary disease, unspecified: Secondary | ICD-10-CM | POA: Diagnosis not present

## 2022-05-31 DIAGNOSIS — E1310 Other specified diabetes mellitus with ketoacidosis without coma: Secondary | ICD-10-CM | POA: Diagnosis not present

## 2022-05-31 DIAGNOSIS — E782 Mixed hyperlipidemia: Secondary | ICD-10-CM

## 2022-05-31 DIAGNOSIS — I10 Essential (primary) hypertension: Secondary | ICD-10-CM | POA: Diagnosis not present

## 2022-05-31 DIAGNOSIS — Z1152 Encounter for screening for COVID-19: Secondary | ICD-10-CM | POA: Diagnosis not present

## 2022-05-31 DIAGNOSIS — R072 Precordial pain: Secondary | ICD-10-CM | POA: Diagnosis present

## 2022-05-31 DIAGNOSIS — R079 Chest pain, unspecified: Principal | ICD-10-CM

## 2022-05-31 DIAGNOSIS — R059 Cough, unspecified: Secondary | ICD-10-CM | POA: Insufficient documentation

## 2022-05-31 DIAGNOSIS — R051 Acute cough: Secondary | ICD-10-CM

## 2022-05-31 DIAGNOSIS — F17210 Nicotine dependence, cigarettes, uncomplicated: Secondary | ICD-10-CM | POA: Diagnosis not present

## 2022-05-31 DIAGNOSIS — E785 Hyperlipidemia, unspecified: Secondary | ICD-10-CM | POA: Diagnosis present

## 2022-05-31 DIAGNOSIS — Z79899 Other long term (current) drug therapy: Secondary | ICD-10-CM | POA: Diagnosis not present

## 2022-05-31 DIAGNOSIS — R0602 Shortness of breath: Secondary | ICD-10-CM | POA: Diagnosis not present

## 2022-05-31 HISTORY — DX: Personal history of tuberculosis: Z86.11

## 2022-05-31 HISTORY — DX: Chronic obstructive pulmonary disease, unspecified: J44.9

## 2022-05-31 HISTORY — DX: Hyperlipidemia, unspecified: E78.5

## 2022-05-31 HISTORY — DX: Major depressive disorder, single episode, unspecified: F32.9

## 2022-05-31 HISTORY — DX: Gastro-esophageal reflux disease without esophagitis: K21.9

## 2022-05-31 HISTORY — DX: Essential (primary) hypertension: I10

## 2022-05-31 HISTORY — DX: Paranoid schizophrenia: F20.0

## 2022-05-31 LAB — URINALYSIS, ROUTINE W REFLEX MICROSCOPIC
Bilirubin Urine: NEGATIVE
Glucose, UA: >=500 mg/dL — AB
Ketones, ur: 80 mg/dL — AB
Nitrite: NEGATIVE
Protein, ur: NEGATIVE mg/dL
Specific Gravity, Urine: 1.023 (ref 1.005–1.030)
WBC, UA: >50 WBC/hpf — ABNORMAL HIGH (ref 0–5)
pH: 5 (ref 5.0–8.0)

## 2022-05-31 LAB — GLUCOSE, CAPILLARY
Glucose-Capillary: 200 mg/dL — ABNORMAL HIGH (ref 70–99)
Glucose-Capillary: 228 mg/dL — ABNORMAL HIGH (ref 70–99)
Glucose-Capillary: 269 mg/dL — ABNORMAL HIGH (ref 70–99)
Glucose-Capillary: 300 mg/dL — ABNORMAL HIGH (ref 70–99)
Glucose-Capillary: 300 mg/dL — ABNORMAL HIGH (ref 70–99)
Glucose-Capillary: 405 mg/dL — ABNORMAL HIGH (ref 70–99)
Glucose-Capillary: 432 mg/dL — ABNORMAL HIGH (ref 70–99)
Glucose-Capillary: 433 mg/dL — ABNORMAL HIGH (ref 70–99)

## 2022-05-31 LAB — CBC
HCT: 42.7 % (ref 39.0–52.0)
Hemoglobin: 14.6 g/dL (ref 13.0–17.0)
MCH: 31.3 pg (ref 26.0–34.0)
MCHC: 34.2 g/dL (ref 30.0–36.0)
MCV: 91.4 fL (ref 80.0–100.0)
Platelets: 184 10*3/uL (ref 150–400)
RBC: 4.67 MIL/uL (ref 4.22–5.81)
RDW: 13.6 % (ref 11.5–15.5)
WBC: 7.2 10*3/uL (ref 4.0–10.5)
nRBC: 0 % (ref 0.0–0.2)

## 2022-05-31 LAB — BETA-HYDROXYBUTYRIC ACID
Beta-Hydroxybutyric Acid: >8 mmol/L — ABNORMAL HIGH (ref 0.05–0.27)
Beta-Hydroxybutyric Acid: 6.68 mmol/L — ABNORMAL HIGH (ref 0.05–0.27)

## 2022-05-31 LAB — CBG MONITORING, ED
Glucose-Capillary: >600 mg/dL (ref 70–99)
Glucose-Capillary: >600 mg/dL (ref 70–99)
Glucose-Capillary: 536 mg/dL (ref 70–99)
Glucose-Capillary: 583 mg/dL (ref 70–99)

## 2022-05-31 LAB — BLOOD GAS, VENOUS
Acid-base deficit: 8.7 mmol/L — ABNORMAL HIGH (ref 0.0–2.0)
Bicarbonate: 17.9 mmol/L — ABNORMAL LOW (ref 20.0–28.0)
Drawn by: 27160
O2 Saturation: 40.5 %
Patient temperature: 36.5
pCO2, Ven: 39 mmHg — ABNORMAL LOW (ref 44–60)
pH, Ven: 7.27 (ref 7.25–7.43)
pO2, Ven: <31 mmHg — CL (ref 32–45)

## 2022-05-31 LAB — BASIC METABOLIC PANEL
Anion gap: 25 — ABNORMAL HIGH (ref 5–15)
BUN: 52 mg/dL — ABNORMAL HIGH (ref 8–23)
CO2: 19 mmol/L — ABNORMAL LOW (ref 22–32)
Calcium: 9.9 mg/dL (ref 8.9–10.3)
Chloride: 85 mmol/L — ABNORMAL LOW (ref 98–111)
Creatinine, Ser: 1.9 mg/dL — ABNORMAL HIGH (ref 0.61–1.24)
GFR, Estimated: 38 mL/min — ABNORMAL LOW (ref >60)
Glucose, Bld: 784 mg/dL (ref 70–99)
Potassium: 4.5 mmol/L (ref 3.5–5.1)
Sodium: 129 mmol/L — ABNORMAL LOW (ref 135–145)

## 2022-05-31 LAB — MRSA NEXT GEN BY PCR, NASAL: MRSA by PCR Next Gen: NOT DETECTED

## 2022-05-31 LAB — TROPONIN I (HIGH SENSITIVITY)
Troponin I (High Sensitivity): 14 ng/L (ref <18)
Troponin I (High Sensitivity): 13 ng/L (ref <18)

## 2022-05-31 MED ORDER — INSULIN REGULAR(HUMAN) IN NACL 100-0.9 UT/100ML-% IV SOLN
INTRAVENOUS | Status: DC
  Administered 2022-05-31: 4.2 [IU]/h via INTRAVENOUS
  Filled 2022-05-31: qty 100

## 2022-05-31 MED ORDER — TRIAMTERENE-HCTZ 75-50 MG PO TABS
1.0000 | ORAL_TABLET | Freq: Every day | ORAL | Status: DC
  Filled 2022-05-31 (×3): qty 1

## 2022-05-31 MED ORDER — LACTATED RINGERS IV BOLUS
20.0000 mL/kg | Freq: Once | INTRAVENOUS | Status: AC
  Administered 2022-05-31: 1270 mL via INTRAVENOUS

## 2022-05-31 MED ORDER — POTASSIUM CHLORIDE 10 MEQ/100ML IV SOLN
INTRAVENOUS | Status: AC
  Filled 2022-05-31: qty 100

## 2022-05-31 MED ORDER — POTASSIUM CHLORIDE 10 MEQ/100ML IV SOLN
10.0000 meq | INTRAVENOUS | Status: AC
  Administered 2022-05-31 (×2): 10 meq via INTRAVENOUS
  Filled 2022-05-31: qty 100

## 2022-05-31 MED ORDER — LACTATED RINGERS IV SOLN: INTRAVENOUS | Status: DC

## 2022-05-31 MED ORDER — SERTRALINE HCL 50 MG PO TABS
25.0000 mg | ORAL_TABLET | Freq: Every day | ORAL | Status: DC
  Administered 2022-06-01: 25 mg via ORAL
  Filled 2022-05-31: qty 1

## 2022-05-31 MED ORDER — SODIUM CHLORIDE 0.9 % IV SOLN
1.0000 g | Freq: Every day | INTRAVENOUS | Status: DC
  Administered 2022-05-31: 1 g via INTRAVENOUS
  Filled 2022-05-31: qty 10

## 2022-05-31 MED ORDER — PRAVASTATIN SODIUM 40 MG PO TABS: 40.0000 mg | ORAL_TABLET | Freq: Every day | ORAL | Status: DC

## 2022-05-31 MED ORDER — ONDANSETRON HCL 4 MG/2ML IJ SOLN: 4.0000 mg | Freq: Four times a day (QID) | INTRAMUSCULAR | Status: DC | PRN

## 2022-05-31 MED ORDER — HEPARIN SODIUM (PORCINE) 5000 UNIT/ML IJ SOLN
5000.0000 [IU] | Freq: Three times a day (TID) | INTRAMUSCULAR | Status: DC
  Administered 2022-05-31 – 2022-06-01 (×3): 5000 [IU] via SUBCUTANEOUS
  Filled 2022-05-31 (×3): qty 1

## 2022-05-31 MED ORDER — METOPROLOL SUCCINATE ER 50 MG PO TB24: 200.0000 mg | ORAL_TABLET | Freq: Every day | ORAL | Status: DC

## 2022-05-31 MED ORDER — ALBUTEROL SULFATE (2.5 MG/3ML) 0.083% IN NEBU
2.5000 mg | INHALATION_SOLUTION | Freq: Four times a day (QID) | RESPIRATORY_TRACT | Status: DC | PRN
  Administered 2022-05-31: 2.5 mg via RESPIRATORY_TRACT
  Filled 2022-05-31: qty 3

## 2022-05-31 MED ORDER — OXYCODONE HCL 5 MG PO TABS: 5.0000 mg | ORAL_TABLET | Freq: Four times a day (QID) | ORAL | Status: DC | PRN

## 2022-05-31 MED ORDER — UMECLIDINIUM-VILANTEROL 62.5-25 MCG/ACT IN AEPB
1.0000 | INHALATION_SPRAY | Freq: Every day | RESPIRATORY_TRACT | Status: DC
  Administered 2022-06-01: 1 via RESPIRATORY_TRACT
  Filled 2022-05-31: qty 14

## 2022-05-31 MED ORDER — SODIUM CHLORIDE 0.9 % IV BOLUS
1000.0000 mL | Freq: Once | INTRAVENOUS | Status: AC
  Administered 2022-05-31: 1000 mL via INTRAVENOUS

## 2022-05-31 MED ORDER — DEXTROSE IN LACTATED RINGERS 5 % IV SOLN: INTRAVENOUS | Status: DC

## 2022-05-31 MED ORDER — GUAIFENESIN-DM 100-10 MG/5ML PO SYRP
5.0000 mL | ORAL_SOLUTION | ORAL | Status: DC | PRN
  Administered 2022-05-31: 5 mL via ORAL
  Filled 2022-05-31: qty 5

## 2022-05-31 MED ORDER — HYDRALAZINE HCL 10 MG PO TABS
20.0000 mg | ORAL_TABLET | Freq: Two times a day (BID) | ORAL | Status: DC
  Administered 2022-05-31 – 2022-06-01 (×2): 20 mg via ORAL
  Filled 2022-05-31 (×2): qty 2

## 2022-05-31 MED ORDER — OLANZAPINE 5 MG PO TABS
20.0000 mg | ORAL_TABLET | Freq: Every day | ORAL | Status: DC
  Administered 2022-06-01: 20 mg via ORAL
  Filled 2022-05-31: qty 4

## 2022-05-31 MED ORDER — ACETAMINOPHEN 325 MG PO TABS: 650.0000 mg | ORAL_TABLET | Freq: Four times a day (QID) | ORAL | Status: DC | PRN

## 2022-05-31 MED ORDER — DEXTROSE 50 % IV SOLN: 0.0000 mL | INTRAVENOUS | Status: DC | PRN

## 2022-05-31 MED ORDER — ASPIRIN 81 MG PO TBEC
81.0000 mg | DELAYED_RELEASE_TABLET | Freq: Every day | ORAL | Status: DC
  Administered 2022-06-01: 81 mg via ORAL
  Filled 2022-05-31: qty 1

## 2022-05-31 MED ORDER — METOPROLOL SUCCINATE ER 50 MG PO TB24
200.0000 mg | ORAL_TABLET | Freq: Every day | ORAL | Status: DC
  Administered 2022-06-01: 200 mg via ORAL
  Filled 2022-05-31: qty 4

## 2022-05-31 MED ORDER — ALBUTEROL SULFATE HFA 108 (90 BASE) MCG/ACT IN AERS: 1.0000 | INHALATION_SPRAY | Freq: Four times a day (QID) | RESPIRATORY_TRACT | Status: DC | PRN

## 2022-05-31 MED ORDER — HYDROXYZINE HCL 25 MG PO TABS
50.0000 mg | ORAL_TABLET | Freq: Every day | ORAL | Status: DC
  Administered 2022-05-31: 50 mg via ORAL
  Filled 2022-05-31: qty 2

## 2022-05-31 MED ORDER — PANTOPRAZOLE SODIUM 40 MG PO TBEC
40.0000 mg | DELAYED_RELEASE_TABLET | Freq: Every day | ORAL | Status: DC
  Administered 2022-06-01: 40 mg via ORAL
  Filled 2022-05-31: qty 1

## 2022-05-31 NOTE — ED Triage Notes (Signed)
Pt c/o mid chest over the last couple of days. Today he woke up feeling weaker than normal and his voice was hoarse. Yesterday he started having dysuria as well. Pt is from [facility]. Denies worse than normal SOB. Slight nausea.

## 2022-05-31 NOTE — Assessment & Plan Note (Addendum)
-  pH 7.27, bicarb 19, glucose 184, beta hydroxybutyrate greater than 8 - Polyuria, polydipsia - Leukocytosis likely needed..  Viral URI or UTI - Continue insulin drip, n.p.o. except for sips and chips, monitor BMP every 4 hours -Check hemoglobin A1c in the a.m. - Continue to monitor

## 2022-05-31 NOTE — Assessment & Plan Note (Signed)
-  Dysuria, urinary frequency - UA shows large leukocytes, rare bacteria, budding yeast present, white blood cell clumps present - Start Rocephin - Culture added onto previous urine - Continue to monitor

## 2022-05-31 NOTE — ED Notes (Signed)
Attempted to call nursing report to ICU

## 2022-05-31 NOTE — Assessment & Plan Note (Signed)
-  Continue statin

## 2022-05-31 NOTE — Assessment & Plan Note (Signed)
-  Patient smokes half a pack per day - Declines nicotine patch at this time - Counseled on the importance of cessation - Continue to monitor

## 2022-05-31 NOTE — Assessment & Plan Note (Signed)
-  Continue metoprolol, hydrochlorothiazide, triamterene - Continue to monitor

## 2022-05-31 NOTE — Assessment & Plan Note (Signed)
-  Continue albuterol inhaler as needed and Anoro

## 2022-05-31 NOTE — ED Notes (Signed)
Hospitalist at bedside 
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
## 2022-05-31 NOTE — Assessment & Plan Note (Addendum)
-  Chest x-ray shows no acute cardiopulmonary disease - Check COVID/flu A, RSV

## 2022-05-31 NOTE — Assessment & Plan Note (Signed)
-  Creatinine 1.90 - No labs to compare to but given polyuria, UTI, DKA this is likely AKI - Continue hydration and trend in the a.m.

## 2022-06-01 DIAGNOSIS — E1310 Other specified diabetes mellitus with ketoacidosis without coma: Secondary | ICD-10-CM | POA: Diagnosis not present

## 2022-06-01 DIAGNOSIS — E1110 Type 2 diabetes mellitus with ketoacidosis without coma: Secondary | ICD-10-CM | POA: Diagnosis not present

## 2022-06-01 LAB — CBC WITH DIFFERENTIAL/PLATELET
Abs Immature Granulocytes: 0.03 10*3/uL (ref 0.00–0.07)
Basophils Absolute: 0 10*3/uL (ref 0.0–0.1)
Basophils Relative: 1 %
Eosinophils Absolute: 0.1 10*3/uL (ref 0.0–0.5)
Eosinophils Relative: 1 %
HCT: 39.2 % (ref 39.0–52.0)
Hemoglobin: 13.7 g/dL (ref 13.0–17.0)
Immature Granulocytes: 0 %
Lymphocytes Relative: 36 %
Lymphs Abs: 2.9 10*3/uL (ref 0.7–4.0)
MCH: 31.2 pg (ref 26.0–34.0)
MCHC: 34.9 g/dL (ref 30.0–36.0)
MCV: 89.3 fL (ref 80.0–100.0)
Monocytes Absolute: 0.8 10*3/uL (ref 0.1–1.0)
Monocytes Relative: 10 %
Neutro Abs: 4.3 10*3/uL (ref 1.7–7.7)
Neutrophils Relative %: 52 %
Platelets: 171 10*3/uL (ref 150–400)
RBC: 4.39 MIL/uL (ref 4.22–5.81)
RDW: 13.2 % (ref 11.5–15.5)
WBC: 8.2 10*3/uL (ref 4.0–10.5)
nRBC: 0 % (ref 0.0–0.2)

## 2022-06-01 LAB — BASIC METABOLIC PANEL
Anion gap: 12 (ref 5–15)
Anion gap: 14 (ref 5–15)
Anion gap: 16 — ABNORMAL HIGH (ref 5–15)
BUN: 39 mg/dL — ABNORMAL HIGH (ref 8–23)
BUN: 43 mg/dL — ABNORMAL HIGH (ref 8–23)
BUN: 50 mg/dL — ABNORMAL HIGH (ref 8–23)
CO2: 22 mmol/L (ref 22–32)
CO2: 24 mmol/L (ref 22–32)
CO2: 25 mmol/L (ref 22–32)
Calcium: 9.1 mg/dL (ref 8.9–10.3)
Calcium: 9.2 mg/dL (ref 8.9–10.3)
Calcium: 9.5 mg/dL (ref 8.9–10.3)
Chloride: 101 mmol/L (ref 98–111)
Chloride: 99 mmol/L (ref 98–111)
Chloride: 99 mmol/L (ref 98–111)
Creatinine, Ser: 1.11 mg/dL (ref 0.61–1.24)
Creatinine, Ser: 1.19 mg/dL (ref 0.61–1.24)
Creatinine, Ser: 1.42 mg/dL — ABNORMAL HIGH (ref 0.61–1.24)
GFR, Estimated: 54 mL/min — ABNORMAL LOW (ref >60)
GFR, Estimated: >60 mL/min (ref >60)
GFR, Estimated: >60 mL/min (ref >60)
Glucose, Bld: 201 mg/dL — ABNORMAL HIGH (ref 70–99)
Glucose, Bld: 224 mg/dL — ABNORMAL HIGH (ref 70–99)
Glucose, Bld: 241 mg/dL — ABNORMAL HIGH (ref 70–99)
Potassium: 3.4 mmol/L — ABNORMAL LOW (ref 3.5–5.1)
Potassium: 3.4 mmol/L — ABNORMAL LOW (ref 3.5–5.1)
Potassium: 3.4 mmol/L — ABNORMAL LOW (ref 3.5–5.1)
Sodium: 137 mmol/L (ref 135–145)
Sodium: 137 mmol/L (ref 135–145)
Sodium: 138 mmol/L (ref 135–145)

## 2022-06-01 LAB — GLUCOSE, CAPILLARY
Glucose-Capillary: 183 mg/dL — ABNORMAL HIGH (ref 70–99)
Glucose-Capillary: 213 mg/dL — ABNORMAL HIGH (ref 70–99)
Glucose-Capillary: 214 mg/dL — ABNORMAL HIGH (ref 70–99)
Glucose-Capillary: 217 mg/dL — ABNORMAL HIGH (ref 70–99)
Glucose-Capillary: 218 mg/dL — ABNORMAL HIGH (ref 70–99)
Glucose-Capillary: 225 mg/dL — ABNORMAL HIGH (ref 70–99)
Glucose-Capillary: 239 mg/dL — ABNORMAL HIGH (ref 70–99)
Glucose-Capillary: 240 mg/dL — ABNORMAL HIGH (ref 70–99)
Glucose-Capillary: 246 mg/dL — ABNORMAL HIGH (ref 70–99)
Glucose-Capillary: 263 mg/dL — ABNORMAL HIGH (ref 70–99)
Glucose-Capillary: 264 mg/dL — ABNORMAL HIGH (ref 70–99)
Glucose-Capillary: 369 mg/dL — ABNORMAL HIGH (ref 70–99)

## 2022-06-01 LAB — MAGNESIUM: Magnesium: 2.1 mg/dL (ref 1.7–2.4)

## 2022-06-01 LAB — HIV ANTIBODY (ROUTINE TESTING W REFLEX): HIV Screen 4th Generation wRfx: NONREACTIVE

## 2022-06-01 LAB — HEMOGLOBIN A1C
Hgb A1c MFr Bld: 10.8 % — ABNORMAL HIGH (ref 4.8–5.6)
Mean Plasma Glucose: 263.26 mg/dL

## 2022-06-01 LAB — RESP PANEL BY RT-PCR (RSV, FLU A&B, COVID)  RVPGX2
Influenza A by PCR: NEGATIVE
Influenza B by PCR: NEGATIVE
Resp Syncytial Virus by PCR: NEGATIVE
SARS Coronavirus 2 by RT PCR: NEGATIVE

## 2022-06-01 LAB — BETA-HYDROXYBUTYRIC ACID: Beta-Hydroxybutyric Acid: 0.6 mmol/L — ABNORMAL HIGH (ref 0.05–0.27)

## 2022-06-01 MED ORDER — INSULIN ASPART 100 UNIT/ML IJ SOLN
0.0000 [IU] | Freq: Three times a day (TID) | INTRAMUSCULAR | Status: DC
  Administered 2022-06-01: 5 [IU] via SUBCUTANEOUS
  Administered 2022-06-01: 15 [IU] via SUBCUTANEOUS

## 2022-06-01 MED ORDER — INSULIN ASPART 100 UNIT/ML IJ SOLN: 0.0000 [IU] | Freq: Every day | INTRAMUSCULAR | Status: DC

## 2022-06-01 MED ORDER — POTASSIUM CHLORIDE 10 MEQ/100ML IV SOLN
10.0000 meq | INTRAVENOUS | Status: AC
  Administered 2022-06-01 (×2): 10 meq via INTRAVENOUS
  Filled 2022-06-01 (×2): qty 100

## 2022-06-01 MED ORDER — INSULIN PEN NEEDLE 32G X 4 MM MISC: 3 refills | Status: AC

## 2022-06-01 MED ORDER — TRIAMTERENE-HCTZ 37.5-25 MG PO TABS
2.0000 | ORAL_TABLET | Freq: Every day | ORAL | Status: DC
  Administered 2022-06-01: 2 via ORAL
  Filled 2022-06-01 (×3): qty 2

## 2022-06-01 MED ORDER — INSULIN GLARGINE-YFGN 100 UNIT/ML ~~LOC~~ SOLN
20.0000 [IU] | Freq: Every day | SUBCUTANEOUS | Status: DC
  Administered 2022-06-01: 20 [IU] via SUBCUTANEOUS
  Filled 2022-06-01 (×3): qty 0.2

## 2022-06-01 MED ORDER — POTASSIUM CHLORIDE CRYS ER 20 MEQ PO TBCR
40.0000 meq | EXTENDED_RELEASE_TABLET | Freq: Once | ORAL | Status: AC
  Administered 2022-06-01: 40 meq via ORAL
  Filled 2022-06-01: qty 2

## 2022-06-01 MED ORDER — BLOOD GLUCOSE MONITOR KIT: PACK | 0 refills | Status: AC

## 2022-06-01 MED ORDER — INSULIN GLARGINE 100 UNIT/ML SOLOSTAR PEN: 20.0000 [IU] | PEN_INJECTOR | Freq: Every day | SUBCUTANEOUS | 11 refills | Status: AC

## 2022-06-01 MED ORDER — CHLORHEXIDINE GLUCONATE CLOTH 2 % EX PADS
6.0000 | MEDICATED_PAD | Freq: Every day | CUTANEOUS | Status: DC
  Administered 2022-06-01: 6 via TOPICAL

## 2022-06-01 MED ORDER — CEPHALEXIN 500 MG PO CAPS: 500.0000 mg | ORAL_CAPSULE | Freq: Four times a day (QID) | ORAL | 0 refills | Status: AC

## 2022-06-01 NOTE — Care Management CC44 (Signed)
Condition Code 44 Documentation Completed  Patient Details  Name: Peter Harris MRN: [id number] Date of Birth: 1955/11/05   Condition Code 44 given:  Yes Patient signature on Condition Code 44 notice:  Yes Documentation of 2 MD's agreement:  Yes Code 44 added to claim:  Yes    Ihor Gully, LCSW 06/01/2022, 3:34 PM

## 2022-06-01 NOTE — Inpatient Diabetes Management (Addendum)
Inpatient Diabetes Program Recommendations  AACE/ADA: New Consensus Statement on Inpatient Glycemic Control (2015)  Target Ranges:  Prepandial:   less than 140 mg/dL      Peak postprandial:   less than 180 mg/dL (1-2 hours)      Critically ill patients:  140 - 180 mg/dL   Lab Results  Component Value Date   GLUCAP 214 (H) 06/01/2022    Review of Glycemic Control From ALF Diabetes history: Pre DM  Current orders for Inpatient glycemic control: IV insulin/ Endotool/DKA  Inpatient Diabetes Program Recommendations:    IV insulin gtt rate 2.2-3.4 units/hour to maintain glucose trends in low 200 range.  -  Start Semglee 20 units (once given overlap 2 hours with IV insulin) -  Novolog 0-15 units tid + hs  Pt will be new to insulin. Will call later today.  Addendum: Spoke with pt over the phone. He states he is from an ALF that helps with medications and glucose checks. They also provide the meals. Discussed with pt food and beverage options. Also encouraged exercise 30 minutes 5 days a week. Explained basic patho of DM and that he will need insulin and to check glucose trends when he returns to ALF.   Spoke with caregiver, Pamala Hurry, over the phone to make sure that they know pt has Diabetes and will need insulin and CBG checks when he returns. Per Pamala Hurry, Pt does not snack but drinks plain regular coffee. Discussed the insulins and doses will be written out in d/c paperwork.  Thanks,  Tama Headings RN, MSN, BC-ADM Inpatient Diabetes Coordinator Team Pager [phone] (8a-5p)

## 2022-06-01 NOTE — Progress Notes (Signed)
Patient's IVs removed WNL. Discharge instructions explained to patient, pt verbalized understanding. Facility notified, awaiting transport from facility for discharge.

## 2022-06-01 NOTE — Discharge Summary (Signed)
Physician Discharge Summary  Peter Harris QIO:[id number] DOB: 13-Sep-1955 DOA: 05/31/2022  PCP: Anselm Jungling, NP  Admit date: 05/31/2022  Discharge date: 06/01/2022  Admitted From:Home  Disposition:  Home  Recommendations for Outpatient Follow-up:  Follow up with PCP in 1-2 weeks Continue on 20 units Lantus daily as prescribed Continue to trend blood glucose with glucometer as prescribed Continue on Keflex as prescribed for UTI  Home Health: None  Equipment/Devices: None  Discharge Condition:Stable  CODE STATUS: Full  Diet recommendation: Heart Healthy/carb modified  Brief/Interim Summary: Peter Harris is a 67 y.o. male with medical history significant of chronic paranoid schizophrenia, COPD, GERD, hyperlipidemia, hypertension, and more presents the ED with a chief complaint of cough, chest pain.  His chest pain was noted to be atypical and likely secondary to musculoskeletal cause.  He was noted to have significant hyperglycemia with DKA in the setting of new onset diabetes along with AKI.  He was also noted to have UTI in the setting of his dysuria and was started on Rocephin empirically with no urine cultures collected.  His DKA has resolved and he is in stable condition for discharge and will remain on oral antibiotics with Keflex as prescribed.  He has been seen by diabetes coordinator with recommendations as noted above.  He will need close follow-up with his PCP.  AKI has resolved with creatinine currently 1.1.  Discharge Diagnoses:  Principal Problem:   DKA (diabetic ketoacidosis) (HCC) Active Problems:   HLD (hyperlipidemia)   Essential hypertension   COPD GOLD 3 / still smoking    Cigarette smoker   UTI (urinary tract infection)   Cough   AKI (acute kidney injury) (HCC)  Principal discharge diagnosis: DKA in the setting of new onset diabetes with associated AKI.  UTI.  Discharge Instructions  Discharge Instructions     Diet - low sodium heart healthy    Complete by: As directed    Increase activity slowly   Complete by: As directed       Allergies as of 06/01/2022   No Known Allergies      Medication List     TAKE these medications    albuterol 108 (90 Base) MCG/ACT inhaler Commonly known as: VENTOLIN HFA Inhale 1 puff into the lungs.   Anoro Ellipta 62.5-25 MCG/ACT Aepb Generic drug: umeclidinium-vilanterol INHALE 1 INHALATION ONCE DAILY IN THE MORNING   aspirin EC 81 MG tablet Take 1 tablet by mouth daily.   blood glucose meter kit and supplies Kit Dispense based on patient and insurance preference. Use up to four times daily as directed.   cephALEXin 500 MG capsule Commonly known as: KEFLEX Take 1 capsule (500 mg total) by mouth 4 (four) times daily for 5 days.   hydrALAZINE 10 MG tablet Commonly known as: APRESOLINE Take 20 mg by mouth 2 (two) times daily.   hydrOXYzine 50 MG tablet Commonly known as: ATARAX Take 1 tablet by mouth at bedtime.   insulin glargine 100 UNIT/ML Solostar Pen Commonly known as: LANTUS Inject 20 Units into the skin daily.   Insulin Pen Needle 32G X 4 MM Misc Use as directed.   loratadine 10 MG tablet Commonly known as: CLARITIN Take 1 tablet by mouth daily.   lovastatin 40 MG tablet Commonly known as: MEVACOR Take 40 mg by mouth at bedtime.   metoprolol 200 MG 24 hr tablet Commonly known as: TOPROL-XL Take 200 mg by mouth daily.   Multi-Vitamin tablet Take 1 tablet by mouth daily.  OLANZapine 20 MG tablet Commonly known as: ZYPREXA Take 20 mg by mouth daily.   omeprazole 20 MG capsule Commonly known as: PRILOSEC Take 20 mg by mouth daily.   psyllium 58.6 % packet Commonly known as: METAMUCIL Take 1 packet by mouth 3 (three) times daily. With 8 oz of water   sertraline 25 MG tablet Commonly known as: ZOLOFT Take 25 mg by mouth daily.   triamterene-hydrochlorothiazide 75-50 MG tablet Commonly known as: MAXZIDE Take 1 tablet by mouth daily.         Follow-up Information     Anselm Jungling, NP. Schedule an appointment as soon as possible for a visit in 1 week(s).   Specialty: Nurse Practitioner Contact information: [address] [address] [place] [address] [phone]                No Known Allergies  Consultations: None   Procedures/Studies: DG Chest Port 1 View  Result Date: 05/31/2022 CLINICAL DATA:  Chest pain EXAM: PORTABLE CHEST 1 VIEW COMPARISON:  12/05/2020 FINDINGS: The heart size and mediastinal contours are within normal limits. Mildly hyperinflated lungs. No focal airspace consolidation, pleural effusion, or pneumothorax. Small metallic density projects over the proximal right clavicle, unchanged. The visualized skeletal structures are unremarkable. IMPRESSION: Mildly hyperinflated lungs. No acute cardiopulmonary findings. Electronically Signed   By: Duanne Guess D.O.   On: 05/31/2022 12:33     Discharge Exam: Vitals:   06/01/22 1200 06/01/22 1300  BP: 103/71 107/65  Pulse:    Resp: 15 18  Temp:    SpO2:     Vitals:   06/01/22 1100 06/01/22 1124 06/01/22 1200 06/01/22 1300  BP: 128/83  103/71 107/65  Pulse:      Resp: 19  15 18   Temp:  97.9 F (36.6 C)    TempSrc:  Oral    SpO2:      Weight:      Height:        General: Pt is alert, awake, not in acute distress Cardiovascular: RRR, S1/S2 +, no rubs, no gallops Respiratory: CTA bilaterally, no wheezing, no rhonchi Abdominal: Soft, NT, ND, bowel sounds + Extremities: no edema, no cyanosis    The results of significant diagnostics from this hospitalization (including imaging, microbiology, ancillary and laboratory) are listed below for reference.     Microbiology: Recent Results (from the past 240 hour(s))  MRSA Next Gen by PCR, Nasal     Status: None   Collection Time: 05/31/22  6:45 PM   Specimen: Nasal Mucosa; Nasal Swab  Result Value Ref Range Status   MRSA by PCR Next Gen NOT DETECTED NOT DETECTED Final    Comment: (NOTE) The  GeneXpert MRSA Assay (FDA approved for NASAL specimens only), is one component of a comprehensive MRSA colonization surveillance program. It is not intended to diagnose MRSA infection nor to guide or monitor treatment for MRSA infections. Test performance is not FDA approved in patients less than 17 years old. Performed at [facility], [address], [place], [place]   Resp panel by RT-PCR (RSV, Flu A&B, Covid) Anterior Nasal Swab     Status: None   Collection Time: 06/01/22  4:29 AM   Specimen: Anterior Nasal Swab  Result Value Ref Range Status   SARS Coronavirus 2 by RT PCR NEGATIVE NEGATIVE Final    Comment: (NOTE) SARS-CoV-2 target nucleic acids are NOT DETECTED.  The SARS-CoV-2 RNA is generally detectable in upper respiratory specimens during the acute phase of infection. The lowest  concentration of SARS-CoV-2 viral copies this assay can detect is 138 copies/mL. A negative result does not preclude SARS-Cov-2 infection and should not be used as the sole basis for treatment or other patient management decisions. A negative result may occur with  improper specimen collection/handling, submission of specimen other than nasopharyngeal swab, presence of viral mutation(s) within the areas targeted by this assay, and inadequate number of viral copies(<138 copies/mL). A negative result must be combined with clinical observations, patient history, and epidemiological information. The expected result is Negative.  Fact Sheet for Patients:  EntrepreneurPulse.com.au  Fact Sheet for Healthcare Providers:  IncredibleEmployment.be  This test is no t yet approved or cleared by the [place] FDA and  has been authorized for detection and/or diagnosis of SARS-CoV-2 by FDA under an Emergency Use Authorization (EUA). This EUA will remain  in effect (meaning this test can be used) for the duration of the COVID-19 declaration under Section  564(b)(1) of the Act, 21 U.S.C.section 360bbb-3(b)(1), unless the authorization is terminated  or revoked sooner.       Influenza A by PCR NEGATIVE NEGATIVE Final   Influenza B by PCR NEGATIVE NEGATIVE Final    Comment: (NOTE) The Xpert Xpress SARS-CoV-2/FLU/RSV plus assay is intended as an aid in the diagnosis of influenza from Nasopharyngeal swab specimens and should not be used as a sole basis for treatment. Nasal washings and aspirates are unacceptable for Xpert Xpress SARS-CoV-2/FLU/RSV testing.  Fact Sheet for Patients: EntrepreneurPulse.com.au  Fact Sheet for Healthcare Providers: IncredibleEmployment.be  This test is not yet approved or cleared by the [place] FDA and has been authorized for detection and/or diagnosis of SARS-CoV-2 by FDA under an Emergency Use Authorization (EUA). This EUA will remain in effect (meaning this test can be used) for the duration of the COVID-19 declaration under Section 564(b)(1) of the Act, 21 U.S.C. section 360bbb-3(b)(1), unless the authorization is terminated or revoked.     Resp Syncytial Virus by PCR NEGATIVE NEGATIVE Final    Comment: (NOTE) Fact Sheet for Patients: EntrepreneurPulse.com.au  Fact Sheet for Healthcare Providers: IncredibleEmployment.be  This test is not yet approved or cleared by the [place] FDA and has been authorized for detection and/or diagnosis of SARS-CoV-2 by FDA under an Emergency Use Authorization (EUA). This EUA will remain in effect (meaning this test can be used) for the duration of the COVID-19 declaration under Section 564(b)(1) of the Act, 21 U.S.C. section 360bbb-3(b)(1), unless the authorization is terminated or revoked.  Performed at [facility], [address], [place], [place] [address]      Labs: BNP (last 3 results) No results for input(s): "BNP" in the last 8760 hours. Basic Metabolic Panel: Recent  Labs  Lab 05/31/22 1225 05/31/22 2014 05/31/22 2336 06/01/22 0411 06/01/22 0808  NA 129* 137 137 137 138  K 4.5 4.3 3.4* 3.4* 3.4*  CL 85* 95* 99 99 101  CO2 19* 20* 22 24 25   GLUCOSE 784* 377* 224* 241* 201*  BUN 52* 53* 50* 43* 39*  CREATININE 1.90* 1.72* 1.42* 1.19 1.11  CALCIUM 9.9 9.7 9.5 9.2 9.1  MG  --   --   --  2.1  --    Liver Function Tests: No results for input(s): "AST", "ALT", "ALKPHOS", "BILITOT", "PROT", "ALBUMIN" in the last 168 hours. No results for input(s): "LIPASE", "AMYLASE" in the last 168 hours. No results for input(s): "AMMONIA" in the last 168 hours. CBC: Recent Labs  Lab 05/31/22 1225 06/01/22 0411  WBC 7.2 8.2  NEUTROABS  --  4.3  HGB 14.6 13.7  HCT 42.7 39.2  MCV 91.4 89.3  PLT 184 171   Cardiac Enzymes: No results for input(s): "CKTOTAL", "CKMB", "CKMBINDEX", "TROPONINI" in the last 168 hours. BNP: Invalid input(s): "POCBNP" CBG: Recent Labs  Lab 06/01/22 0644 06/01/22 0749 06/01/22 0849 06/01/22 1003 06/01/22 1121  GLUCAP 246* 214* 183* 218* 217*   D-Dimer No results for input(s): "DDIMER" in the last 72 hours. Hgb A1c Recent Labs    06/01/22 0411  HGBA1C 10.8*   Lipid Profile No results for input(s): "CHOL", "HDL", "LDLCALC", "TRIG", "CHOLHDL", "LDLDIRECT" in the last 72 hours. Thyroid function studies No results for input(s): "TSH", "T4TOTAL", "T3FREE", "THYROIDAB" in the last 72 hours.  Invalid input(s): "FREET3" Anemia work up No results for input(s): "VITAMINB12", "FOLATE", "FERRITIN", "TIBC", "IRON", "RETICCTPCT" in the last 72 hours. Urinalysis    Component Value Date/Time   COLORURINE YELLOW 05/31/2022 1723   APPEARANCEUR CLOUDY (A) 05/31/2022 1723   LABSPEC 1.023 05/31/2022 1723   PHURINE 5.0 05/31/2022 1723   GLUCOSEU >=500 (A) 05/31/2022 1723   HGBUR SMALL (A) 05/31/2022 1723   BILIRUBINUR NEGATIVE 05/31/2022 1723   KETONESUR 80 (A) 05/31/2022 1723   PROTEINUR NEGATIVE 05/31/2022 1723   NITRITE  NEGATIVE 05/31/2022 1723   LEUKOCYTESUR LARGE (A) 05/31/2022 1723   Sepsis Labs Recent Labs  Lab 05/31/22 1225 06/01/22 0411  WBC 7.2 8.2   Microbiology Recent Results (from the past 240 hour(s))  MRSA Next Gen by PCR, Nasal     Status: None   Collection Time: 05/31/22  6:45 PM   Specimen: Nasal Mucosa; Nasal Swab  Result Value Ref Range Status   MRSA by PCR Next Gen NOT DETECTED NOT DETECTED Final    Comment: (NOTE) The GeneXpert MRSA Assay (FDA approved for NASAL specimens only), is one component of a comprehensive MRSA colonization surveillance program. It is not intended to diagnose MRSA infection nor to guide or monitor treatment for MRSA infections. Test performance is not FDA approved in patients less than 3 years old. Performed at [facility], [address] [place], [place] [address]   Resp panel by RT-PCR (RSV, Flu A&B, Covid) Anterior Nasal Swab     Status: None   Collection Time: 06/01/22  4:29 AM   Specimen: Anterior Nasal Swab  Result Value Ref Range Status   SARS Coronavirus 2 by RT PCR NEGATIVE NEGATIVE Final    Comment: (NOTE) SARS-CoV-2 target nucleic acids are NOT DETECTED.  The SARS-CoV-2 RNA is generally detectable in upper respiratory specimens during the acute phase of infection. The lowest concentration of SARS-CoV-2 viral copies this assay can detect is 138 copies/mL. A negative result does not preclude SARS-Cov-2 infection and should not be used as the sole basis for treatment or other patient management decisions. A negative result may occur with  improper specimen collection/handling, submission of specimen other than nasopharyngeal swab, presence of viral mutation(s) within the areas targeted by this assay, and inadequate number of viral copies(<138 copies/mL). A negative result must be combined with clinical observations, patient history, and epidemiological information. The expected result is Negative.  Fact Sheet for Patients:   BloggerCourse.com  Fact Sheet for Healthcare Providers:  SeriousBroker.it  This test is no t yet approved or cleared by the [place] FDA and  has been authorized for detection and/or diagnosis of SARS-CoV-2 by FDA under an Emergency Use Authorization (EUA). This EUA will remain  in effect (meaning this test can be used) for the duration  of the COVID-19 declaration under Section 564(b)(1) of the Act, 21 U.S.C.section 360bbb-3(b)(1), unless the authorization is terminated  or revoked sooner.       Influenza A by PCR NEGATIVE NEGATIVE Final   Influenza B by PCR NEGATIVE NEGATIVE Final    Comment: (NOTE) The Xpert Xpress SARS-CoV-2/FLU/RSV plus assay is intended as an aid in the diagnosis of influenza from Nasopharyngeal swab specimens and should not be used as a sole basis for treatment. Nasal washings and aspirates are unacceptable for Xpert Xpress SARS-CoV-2/FLU/RSV testing.  Fact Sheet for Patients: EntrepreneurPulse.com.au  Fact Sheet for Healthcare Providers: IncredibleEmployment.be  This test is not yet approved or cleared by the [place] FDA and has been authorized for detection and/or diagnosis of SARS-CoV-2 by FDA under an Emergency Use Authorization (EUA). This EUA will remain in effect (meaning this test can be used) for the duration of the COVID-19 declaration under Section 564(b)(1) of the Act, 21 U.S.C. section 360bbb-3(b)(1), unless the authorization is terminated or revoked.     Resp Syncytial Virus by PCR NEGATIVE NEGATIVE Final    Comment: (NOTE) Fact Sheet for Patients: EntrepreneurPulse.com.au  Fact Sheet for Healthcare Providers: IncredibleEmployment.be  This test is not yet approved or cleared by the [place] FDA and has been authorized for detection and/or diagnosis of SARS-CoV-2 by FDA under an Emergency Use  Authorization (EUA). This EUA will remain in effect (meaning this test can be used) for the duration of the COVID-19 declaration under Section 564(b)(1) of the Act, 21 U.S.C. section 360bbb-3(b)(1), unless the authorization is terminated or revoked.  Performed at [facility], [address], [place], [place] [address]      Time coordinating discharge: 35 minutes  SIGNED:   Rodena Goldmann, DO [facility] 06/01/2022, 3:07 PM  If 7PM-7AM, please contact night-coverage [email or web address]

## 2022-06-01 NOTE — Plan of Care (Signed)
  Problem: Nutritional: Goal: Maintenance of adequate nutrition will improve Outcome: Progressing   

## 2022-06-01 NOTE — Progress Notes (Signed)
  Transition of Care [facility]) Screening Note   Patient Details  Name: Peter Harris Date of Birth: 1956-02-01   Transition of Care [facility]) CM/SW Contact:    Iona Beard, [place] Phone Number: 06/01/2022, 11:27 AM    Transition of Care Department [facility]) has reviewed patient and no TOC needs have been identified at this time. We will continue to monitor patient advancement through interdisciplinary progression rounds. If new patient transition needs arise, please place a TOC consult.

## 2022-06-01 NOTE — Care Management Obs Status (Signed)
[facility] NOTIFICATION   Patient Details  Name: Peter Harris MRN: [id number] Date of Birth: October 19, 1955   Medicare Observation Status Notification Given:  Yes    Ihor Gully, LCSW 06/01/2022, 3:34 PM

## 2022-06-01 NOTE — Progress Notes (Signed)
Semglee given at 1115. Insulin gtt and fluids to be cut off in 2 hours at 1315.

## 2022-06-02 LAB — BASIC METABOLIC PANEL
Anion gap: 22 — ABNORMAL HIGH (ref 5–15)
BUN: 53 mg/dL — ABNORMAL HIGH (ref 8–23)
CO2: 20 mmol/L — ABNORMAL LOW (ref 22–32)
Calcium: 9.7 mg/dL (ref 8.9–10.3)
Chloride: 95 mmol/L — ABNORMAL LOW (ref 98–111)
Creatinine, Ser: 1.72 mg/dL — ABNORMAL HIGH (ref 0.61–1.24)
GFR, Estimated: 43 mL/min — ABNORMAL LOW (ref >60)
Glucose, Bld: 377 mg/dL — ABNORMAL HIGH (ref 70–99)
Potassium: 4.3 mmol/L (ref 3.5–5.1)
Sodium: 137 mmol/L (ref 135–145)

## 2022-06-02 LAB — URINE CULTURE: Culture: 30000 — AB

## 2022-06-18 ENCOUNTER — Ambulatory Visit (INDEPENDENT_AMBULATORY_CARE_PROVIDER_SITE_OTHER): Payer: Medicare Other | Admitting: Internal Medicine

## 2022-06-18 ENCOUNTER — Encounter: Payer: Self-pay | Admitting: Internal Medicine

## 2022-06-18 VITALS — BP 130/68 | HR 74 | Temp 98.4°F | Ht 70.0 in | Wt 142.2 lb

## 2022-06-18 DIAGNOSIS — J449 Chronic obstructive pulmonary disease, unspecified: Secondary | ICD-10-CM | POA: Diagnosis not present

## 2022-06-18 DIAGNOSIS — F17210 Nicotine dependence, cigarettes, uncomplicated: Secondary | ICD-10-CM

## 2022-06-18 DIAGNOSIS — I10 Essential (primary) hypertension: Secondary | ICD-10-CM | POA: Diagnosis not present

## 2022-06-18 NOTE — Assessment & Plan Note (Signed)
In the setting of respiratory symptoms of unknown etiology,  It would be preferable to use bystolic, the most beta -1  selective Beta blocker available in sample form, with bisoprolol the most selective generic choice  on the market, at least on a trial basis, to make sure the spillover Beta 2 effects of the less specific Beta blockers are not contributing to this patient's symptoms.   Consider change to bisoprolol 10 mg bid from toprol 200 mg daily

## 2022-06-18 NOTE — Assessment & Plan Note (Signed)
Onset doe  prior to 2020 with emphysema suggested on cxr 12/05/20  -  12/05/2020   Walked RA  fast pace for 3 laps without stopping. He did not complain of shortness of breath or trouble breathing during the walk with sats 98% at end. - 12/05/2020  After extensive coaching inhaler device,  effectiveness =    90%  DPI  > change spiriva to anoro daily  - PFT's   03/25/21  FEV1 1.30 (36 % ) ratio 0.53  p 0 % improvement from saba p Anoro  prior to study with DLCO  6.27 (22%) corrects to 1.30 (31%)  for alv volume and FV curve classically concave   - 11/21/2021   Walked on RA   x  2.5   lap(s) =  approx 375  ft  @ moderately  fast pace, stopped due to sob  with lowest 02 sats 96% - 11/26/21  LDSCT Mild diffuse bronchial wall thickening with mild centrilobularand paraseptal emphysema - 06/18/2022   Walked on RA  x  3  lap(s) =  approx 450  ft  @ mod pace, stopped due to end of study s sob  with lowest 02 sats 95%    Pt is Group B in terms of symptom/risk and laba/lama therefore appropriate rx at this point >>>  continue anoro/ be aware of possible interaction with high dose toprol (see hbp)

## 2022-06-18 NOTE — Progress Notes (Signed)
Peter Harris, male    DOB: 12-09-1955   MRN: [id number]   Brief patient profile:  67 yo bm   active smoker who lives in group home referred to pulmonary clinic in [place]  12/05/2020 by   Lauretta Grill NP for doe x 86ft end of driveway level onset spring 2022 though placed on spiriva and albuterol since at least 2020 (not really sure)      History of Present Illness  12/05/2020  Pulmonary/ 1st office eval/ Sona Nations / [place] [facility]  Chief Complaint  Patient presents with   Consult    Patient reports that he has had short of breath for years and worse in last 6 months.   Dyspnea:  baseline not very active but able to walk a mall  Cough: none  Sleep: no problem on side / flat bed  SABA use: saba first thing in am / occ in pm Rec Try off spiriva and on Anoro one click first thing each am  Only use your albuterol as a rescue medication to be used if you can't catch your breath  If doing better on samples of Anoro, ok to fill the prescription given today The key is to stop smoking completely before smoking completely stops you!    11/21/2021  f/u ov/[place] office/Maverick Patman re: GOLD 3 maint on Anoro each am / still smoking   Chief Complaint  Patient presents with   Follow-up    Breathing doing well. But patient is still smoking   Dyspnea:  no longer walking driveway / walking outside carrying groceries ok  Cough: minimal in am  Sleeping: flat bed/ 2 pillows  SABA use: very rarely  02: none  Rec The key is to stop smoking completely before smoking completely stops you! No change in medications   06/18/2022  f/u ov/ office/Lachele Lievanos re: GOLD 3 / copd still smoking  maint on Anoro  / high dose toprol  Chief Complaint  Patient presents with   Follow-up    Breathing okay  States he is still smoking   Dyspnea:  hauling trach/ end of driveway flat stops half way  Cough: none  Sleeping: fine flat bed one pillow  SABA use: rarely albuterol / never prechallenges  02: none   Lung cancer screening: in program    No obvious day to day or daytime variability or assoc excess/ purulent sputum or mucus plugs or hemoptysis or cp or chest tightness, subjective wheeze or overt sinus or hb symptoms.   Sleeping  without nocturnal  or early am exacerbation  of respiratory  c/o's or need for noct saba. Also denies any obvious fluctuation of symptoms with weather or environmental changes or other aggravating or alleviating factors except as outlined above   No unusual exposure hx or h/o childhood pna/ asthma or knowledge of premature birth.  Current Allergies, Complete Past Medical History, Past Surgical History, Family History, and Social History were reviewed in [employer] record.  ROS  The following are not active complaints unless bolded Hoarseness, sore throat, dysphagia, dental problems, itching, sneezing,  nasal congestion or discharge of excess mucus or purulent secretions, ear ache,   fever, chills, sweats, unintended wt loss or wt gain, classically pleuritic or exertional cp,  orthopnea pnd or arm/hand swelling  or leg swelling, presyncope, palpitations, abdominal pain, anorexia, nausea, vomiting, diarrhea  or change in bowel habits or change in bladder habits, change in stools or change in urine, dysuria, hematuria,  rash, arthralgias, visual complaints,  headache, numbness, weakness or ataxia or problems with walking or coordination,  change in mood or  memory.        Current Meds  Medication Sig   albuterol (VENTOLIN HFA) 108 (90 Base) MCG/ACT inhaler Inhale 1 puff into the lungs.   aspirin 81 MG EC tablet Take 1 tablet by mouth daily.   blood glucose meter kit and supplies KIT Dispense based on patient and insurance preference. Use up to four times daily as directed.   HUMULIN R 100 UNIT/ML injection    hydrALAZINE (APRESOLINE) 10 MG tablet Take 20 mg by mouth 2 (two) times daily.   hydrOXYzine (ATARAX/VISTARIL) 50 MG tablet Take 1 tablet  by mouth at bedtime.   insulin glargine (LANTUS) 100 UNIT/ML Solostar Pen Inject 20 Units into the skin daily.   Insulin Pen Needle 32G X 4 MM MISC Use as directed.   loratadine (CLARITIN) 10 MG tablet Take 1 tablet by mouth daily.   lovastatin (MEVACOR) 40 MG tablet Take 40 mg by mouth at bedtime.   metoprolol (TOPROL-XL) 200 MG 24 hr tablet Take 200 mg by mouth daily.   Multiple Vitamin (MULTI-VITAMIN) tablet Take 1 tablet by mouth daily.   OLANZapine (ZYPREXA) 20 MG tablet Take 20 mg by mouth daily.   omeprazole (PRILOSEC) 20 MG capsule Take 20 mg by mouth daily.   psyllium (METAMUCIL) 58.6 % packet Take 1 packet by mouth 3 (three) times daily. With 8 oz of water   sertraline (ZOLOFT) 25 MG tablet Take 25 mg by mouth daily.   triamterene-hydrochlorothiazide (MAXZIDE) 75-50 MG tablet Take 1 tablet by mouth daily.   umeclidinium-vilanterol (ANORO ELLIPTA) 62.5-25 MCG/ACT AEPB INHALE 1 INHALATION ONCE DAILY IN THE MORNING                 Objective:    wts   06/18/2022         142  11/21/2021         141   04/15/21 136 lb 1.9 oz (61.7 kg)  12/05/20 136 lb (61.7 kg)    Vital signs reviewed  06/18/2022  - Note at rest 02 sats  96% on RA   General appearance:    amb animated bm nad  HEENT : masked    NECK :  without  apparent JVD/ palpable Nodes/TM    LUNGS: no acc muscle use,  Mild barrel  contour chest wall with bilateral  Distant bs s audible wheeze and  without cough on insp or exp maneuvers  and mild  Hyperresonant  to  percussion bilaterally     CV:  RRR  no s3 or murmur or increase in P2, and no edema   ABD:  soft and nontender   MS:  Nl gait/ ext warm without deformities Or obvious joint restrictions  calf tenderness, cyanosis or clubbing     SKIN: warm and dry without lesions    NEURO:  alert, approp, nl sensorium with  no motor or cerebellar deficits apparent.         Assessment

## 2022-06-18 NOTE — Patient Instructions (Signed)
I would recommend your blood pressure pill called metaprolol be change to bisoprolol  10 mg twice daily to reduce the possibility the metaprolol in high doses is interfering with the Anoro    No change in Anoro   Only use your albuterol as a rescue medication to be used if you can't catch your breath by resting or doing a relaxed purse lip breathing pattern.  - The less you use it, the better it will work when you need it. - Ok to use up to 2 puffs  every 4 hours if you must but call for immediate appointment if use goes up over your usual need - Don't leave home without it !!  (think of it like the spare tire for your car)   Also  Ok to try albuterol 15 min before an activity (on alternating days)  that you know would usually make you short of breath and see if it makes any difference and if makes none then don't take albuterol after activity unless you can't catch your breath as this means it's the resting that helps, not the albuterol.      The key is to stop smoking completely before smoking completely stops you!       Please schedule a follow up visit in 6 months but call sooner if needed

## 2022-06-18 NOTE — Assessment & Plan Note (Signed)
LDSCT  11/26/21   RADS 2 > f/u one year  Reviewed CT/  Counseled re importance of smoking cessation but did not meet time criteria for separate billing    Each maintenance medication was reviewed in detail including emphasizing most importantly the difference between maintenance and prns and under what circumstances the prns are to be triggered using an action plan format where appropriate.  Total time for H and P, chart review, counseling, reviewing dpi device(s) , directly observing portions of ambulatory 02 saturation study/ and generating customized AVS unique to this office visit / same day charting = 30 min

## 2022-09-09 ENCOUNTER — Other Ambulatory Visit: Payer: Self-pay | Admitting: *Deleted

## 2022-09-09 MED ORDER — ANORO ELLIPTA 62.5-25 MCG/ACT IN AEPB: INHALATION_SPRAY | RESPIRATORY_TRACT | 6 refills | Status: DC

## 2022-12-09 ENCOUNTER — Ambulatory Visit: Payer: Medicare Other | Admitting: Internal Medicine

## 2022-12-24 NOTE — Progress Notes (Signed)
Peter Harris, male    DOB: 04-23-1956   MRN: [id number]   Brief patient profile:  74 yobm  active smoker who lives in group home referred to pulmonary clinic in [place]  12/05/2020 by   Anselm Jungling NP for doe x 80ft end of driveway level onset spring 2022 though placed on spiriva and albuterol since at least 2020 (not really sure)      History of Present Illness  12/05/2020  Pulmonary/ 1st office eval/ Madix Blowe / [place] [facility]  Chief Complaint  Patient presents with   Consult    Patient reports that he has had short of breath for years and worse in last 6 months.   Dyspnea:  baseline not very active but able to walk a mall  Cough: none  Sleep: no problem on side / flat bed  SABA use: saba first thing in am / occ in pm Rec Try off spiriva and on Anoro one click first thing each am  Only use your albuterol as a rescue medication to be used if you can't catch your breath  If doing better on samples of Anoro, ok to fill the prescription given today The key is to stop smoking completely before smoking completely stops you!    11/21/2021  f/u ov/[place] office/Mcclellan Demarais re: GOLD 3 maint on Anoro each am / still smoking   Chief Complaint  Patient presents with   Follow-up    Breathing doing well. But patient is still smoking   Dyspnea:  no longer walking driveway / walking outside carrying groceries ok  Cough: minimal in am  Sleeping: flat bed/ 2 pillows  SABA use: very rarely  02: none  Rec The key is to stop smoking completely before smoking completely stops you! No change in medications   06/18/2022  f/u ov/[place] office/Tenaya Hilyer re: GOLD 3 / copd still smoking  maint on Anoro  / high dose toprol  Chief Complaint  Patient presents with   Follow-up    Breathing okay  States he is still smoking   Dyspnea:  hauling trash/ end of driveway flat stops half way  Cough: none  Sleeping: fine flat bed one pillow  SABA use: rarely albuterol / never prechallenges  02: none  Lung  cancer screening: in program  Rec I would recommend your blood pressure pill called metaprolol be change to bisoprolol  10 mg twice daily to reduce the possibility the metaprolol in high doses is interfering with the Anoro   No change in Anoro  Only use your albuterol as a rescue medication Also  Ok to try albuterol 15 min before an activity (on alternating days)  that you know would usually make you short of breath The key is to stop smoking completely before smoking completely stops you!       12/25/2022 6 m  f/u ov/[place] office/Heena Woodbury re: GOLD 3 maint on Anoro   Chief Complaint  Patient presents with   COPD    Gold 3  Dyspnea:  very sedentary / still able to take the trash out flat grade x 50 ft  Cough: none  Sleeping:  flat bed / 2 pillows no resp cc  SABA use: once a week 02: none      No obvious day to day or daytime variability or assoc excess/ purulent sputum or mucus plugs or hemoptysis or cp or chest tightness, subjective wheeze or overt sinus or hb symptoms.    . Also denies any obvious fluctuation of symptoms with  weather or environmental changes or other aggravating or alleviating factors except as outlined above   No unusual exposure hx or h/o childhood pna/ asthma or knowledge of premature birth.  Current Allergies, Complete Past Medical History, Past Surgical History, Family History, and Social History were reviewed in [employer] record.  ROS  The following are not active complaints unless bolded Hoarseness, sore throat, dysphagia, dental problems, itching, sneezing,  nasal congestion or discharge of excess mucus or purulent secretions, ear ache,   fever, chills, sweats, unintended wt loss or wt gain, classically pleuritic or exertional cp,  orthopnea pnd or arm/hand swelling  or leg swelling, presyncope, palpitations, abdominal pain, anorexia, nausea, vomiting, diarrhea  or change in bowel habits or change in bladder habits, change in stools  or change in urine, dysuria, hematuria,  rash, arthralgias, visual complaints, headache, numbness, weakness or ataxia or problems with walking or coordination,  change in mood or  memory.        Current Meds  Medication Sig   albuterol (VENTOLIN HFA) 108 (90 Base) MCG/ACT inhaler Inhale 1 puff into the lungs.   aspirin 81 MG EC tablet Take 1 tablet by mouth daily.   bisoprolol (ZEBETA) 10 MG tablet Take 10 mg by mouth daily.   blood glucose meter kit and supplies KIT Dispense based on patient and insurance preference. Use up to four times daily as directed.   HUMULIN R 100 UNIT/ML injection    hydrALAZINE (APRESOLINE) 10 MG tablet Take 20 mg by mouth 2 (two) times daily.   hydrOXYzine (ATARAX) 25 MG tablet Take 25 mg by mouth 2 (two) times daily.   insulin glargine (LANTUS) 100 UNIT/ML Solostar Pen Inject 20 Units into the skin daily.   Insulin Pen Needle 32G X 4 MM MISC Use as directed.   loratadine (CLARITIN) 10 MG tablet Take 1 tablet by mouth daily.   lovastatin (MEVACOR) 40 MG tablet Take 40 mg by mouth at bedtime.   Multiple Vitamin (MULTI-VITAMIN) tablet Take 1 tablet by mouth daily.   OLANZapine (ZYPREXA) 20 MG tablet Take 20 mg by mouth daily.   omeprazole (PRILOSEC) 20 MG capsule Take 20 mg by mouth daily.   psyllium (METAMUCIL) 58.6 % packet Take 1 packet by mouth 3 (three) times daily. With 8 oz of water   sertraline (ZOLOFT) 25 MG tablet Take 25 mg by mouth daily.   triamterene-hydrochlorothiazide (MAXZIDE) 75-50 MG tablet Take 1 tablet by mouth daily.   umeclidinium-vilanterol (ANORO ELLIPTA) 62.5-25 MCG/ACT AEPB INHALE 1 INHALATION ONCE DAILY IN THE MORNING   [DISCONTINUED] insulin glargine-yfgn (SEMGLEE) 100 UNIT/ML Pen Inject into the skin.                Objective:    wts   12/25/2022         148  06/18/2022         142  11/21/2021         141   04/15/21 136 lb 1.9 oz (61.7 kg)  12/05/20 136 lb (61.7 kg)    Vital signs reviewed  12/25/2022  - Note at rest 02 sats   95% on RA   General appearance:    happy amb bm nad    HEENT : Oropharynx  clear/ edentulous      NECK :  without  apparent JVD/ palpable Nodes/TM    LUNGS: no acc muscle use,  Mild barrel  contour chest wall with bilateral  Distant bs s audible wheeze and  without  cough on insp or exp maneuvers  and mild  Hyperresonant  to  percussion bilaterally     CV:  RRR  no s3 or murmur or increase in P2, and no edema   ABD:  soft and nontender with pos end  insp Hoover's  in the supine position.  No bruits or organomegaly appreciated   MS:  Nl gait/ ext warm without deformities Or obvious joint restrictions  calf tenderness, cyanosis or clubbing     SKIN: warm and dry without lesions    NEURO:  alert, approp, nl sensorium with  no motor or cerebellar deficits apparent.           Assessment

## 2022-12-25 ENCOUNTER — Encounter: Payer: Self-pay | Admitting: Internal Medicine

## 2022-12-25 ENCOUNTER — Ambulatory Visit (INDEPENDENT_AMBULATORY_CARE_PROVIDER_SITE_OTHER): Payer: Medicare Other | Admitting: Internal Medicine

## 2022-12-25 VITALS — BP 119/74 | HR 75 | Ht 70.0 in | Wt 148.0 lb

## 2022-12-25 DIAGNOSIS — I10 Essential (primary) hypertension: Secondary | ICD-10-CM

## 2022-12-25 DIAGNOSIS — F17210 Nicotine dependence, cigarettes, uncomplicated: Secondary | ICD-10-CM

## 2022-12-25 DIAGNOSIS — J449 Chronic obstructive pulmonary disease, unspecified: Secondary | ICD-10-CM | POA: Diagnosis not present

## 2022-12-25 NOTE — Assessment & Plan Note (Addendum)
Adequate rx on the most specific Beta blocker = bisoprolol 10 mg daily   Although even in retrospect it may not be clear that the non-sepcific BBs contributed to the pt's symptoms,  Pt improved off them and adding them back at this point or in the future would risk confusion in interpretation of non-specific respiratory symptoms to which this patient is prone  ie  Better not to muddy the waters here.   >>> continue bisoprolol 10 mg daily    Each maintenance medication was reviewed in detail including emphasizing most importantly the difference between maintenance and prns and under what circumstances the prns are to be triggered using an action plan format where appropriate.  Total time for H and P, chart review, counseling, reviewing dpi, hfa device(s) and generating customized AVS unique to this office visit / same day charting = 20 min

## 2022-12-25 NOTE — Assessment & Plan Note (Addendum)
Onset doe  prior to 2020 with emphysema suggested on cxr 12/05/20  -  12/05/2020   Walked RA  fast pace for 3 laps without stopping. He did not complain of shortness of breath or trouble breathing during the walk with sats 98% at end. - 12/05/2020  After extensive coaching inhaler device,  effectiveness =    90%  DPI  > change spiriva to anoro daily  - PFT's   03/25/21  FEV1 1.30 (36 % ) ratio 0.53  p 0 % improvement from saba p Anoro  prior to study with DLCO  6.27 (22%) corrects to 1.30 (31%)  for alv volume and FV curve classically concave   - 11/21/2021   Walked on RA   x  2.5   lap(s) =  approx 375  ft  @ moderately  fast pace, stopped due to sob  with lowest 02 sats 96% - 11/26/21  LDSCT Mild diffuse bronchial wall thickening with mild centrilobularand paraseptal emphysema - 06/18/2022   Walked on RA  x  3  lap(s) =  approx 450  ft  @ mod pace, stopped due to end of study s sob  with lowest 02 sats 95%    Pt is Group B in terms of symptom/risk and laba/lama therefore appropriate rx at this point >>>  continue anoro and approp saba  F/u yearly

## 2022-12-25 NOTE — Assessment & Plan Note (Signed)
LDSCT  11/26/21   RADS 2 > f/u one year  F/u overdue > Low-dose CT lung cancer screening is recommended for patients who are 57-68 years of age with a 20+ pack-year history of smoking and who are currently smoking or quit <=15 years ago. No coughing up blood  No unintentional weight loss of > 15 pounds in the last 6 months - pt is eligible for scanning yearly until age 21 > referred to [facility] program to keep up with repeat studies  4-5 min discussion re active cigarette smoking in addition to office E&M  Ask about tobacco use:   ongoing Advise quitting   I took an extended  opportunity with this patient to outline the consequences of continued cigarette use  in airway disorders based on all the data we have from the multiple national lung health studies (perfomed over decades at millions of dollars in cost)  indicating that smoking cessation, not choice of inhalers or pulmonary physicians, is the most important aspect of his  care.   Assess willingness:  Not committed at this point Assist in quit attempt:  Per PCP when ready Arrange follow up:   Follow up per Primary Care planned

## 2022-12-25 NOTE — Patient Instructions (Signed)
My office will be contacting you by phone for referral to lung cancer screening program   - if you don't hear back from my office within one week please call us back or notify us thru MyChart and we'll address it right away.   The key is to stop smoking completely before smoking completely stops you!    Please schedule a follow up visit in 12 months but call sooner if needed

## 2023-01-28 IMAGING — US US SOFT TISSUE HEAD/NECK
1 series · 13 of 19 positions shown · non-contrast
Comparison: None available.

CLINICAL DATA: Initial evaluation for localized swelling, asymmetry
of left submandibular gland.

EXAM:
ULTRASOUND OF HEAD/NECK SOFT TISSUES
TECHNIQUE: Ultrasound examination of the head and neck soft tissues was
performed in the area of clinical concern.

[Series 1: us soft tissue head/neck · 0.07mm/px · 13 of 19 slices shown]
[im 1/19]
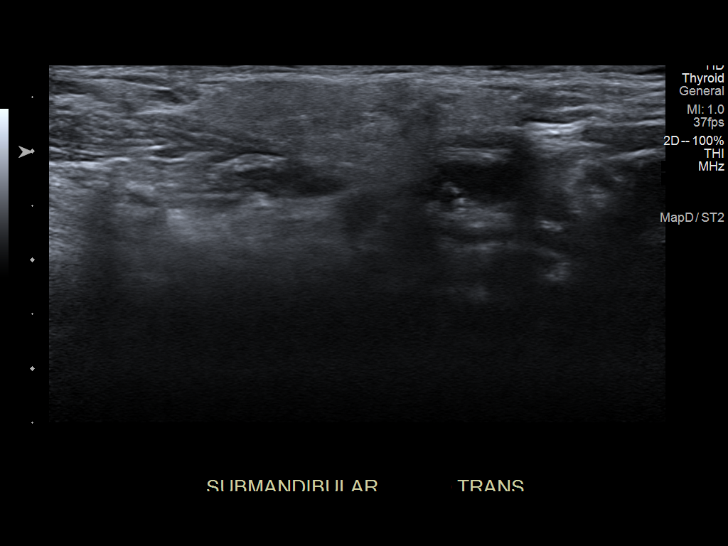
[im 3/19]
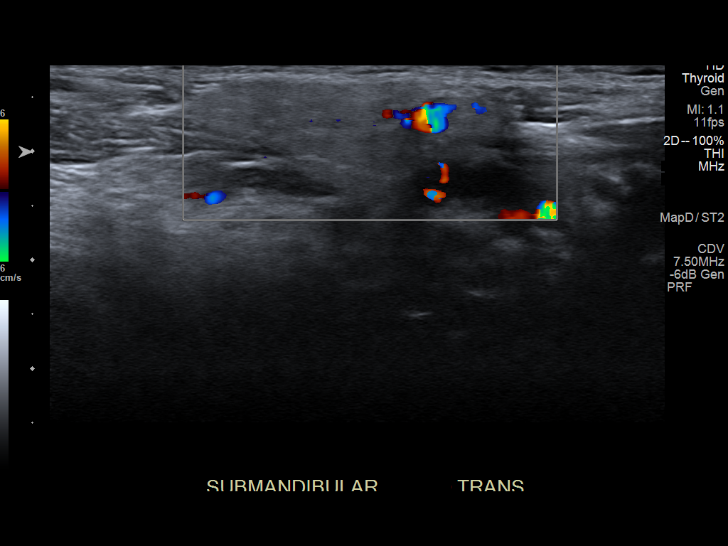
[im 4/19]
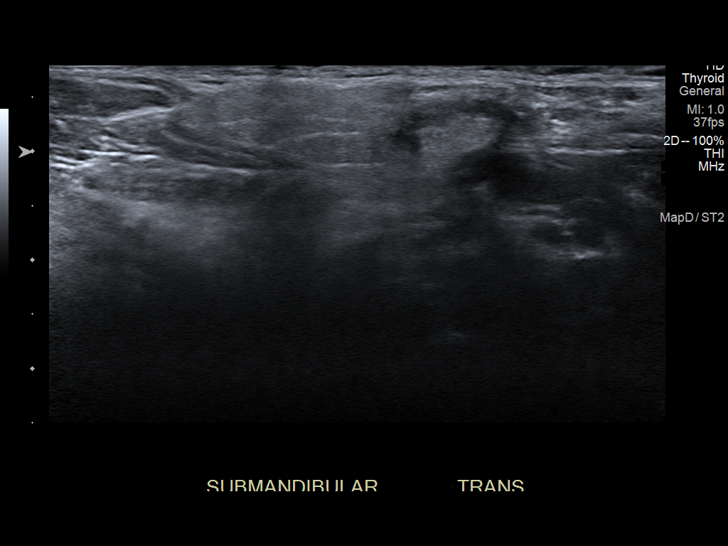
[im 6/19]
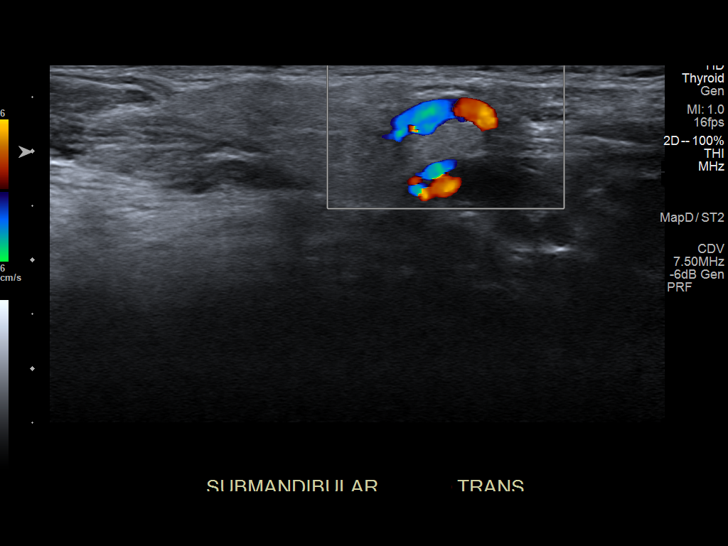
[im 7/19]
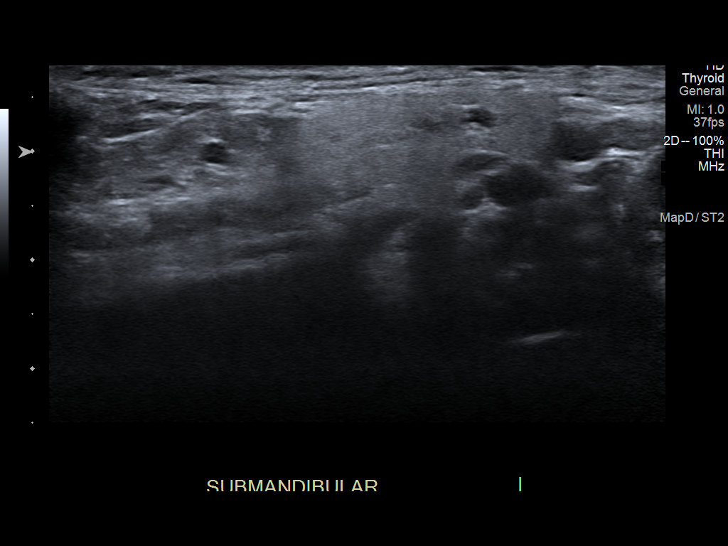
[im 9/19]
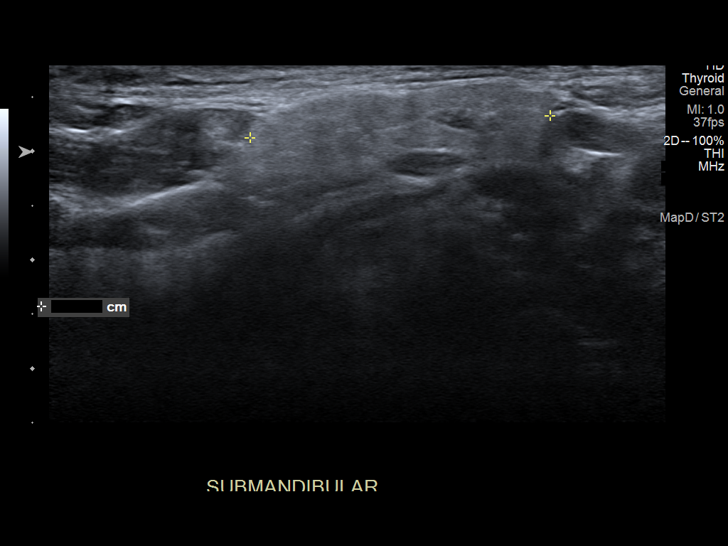
[im 10/19]
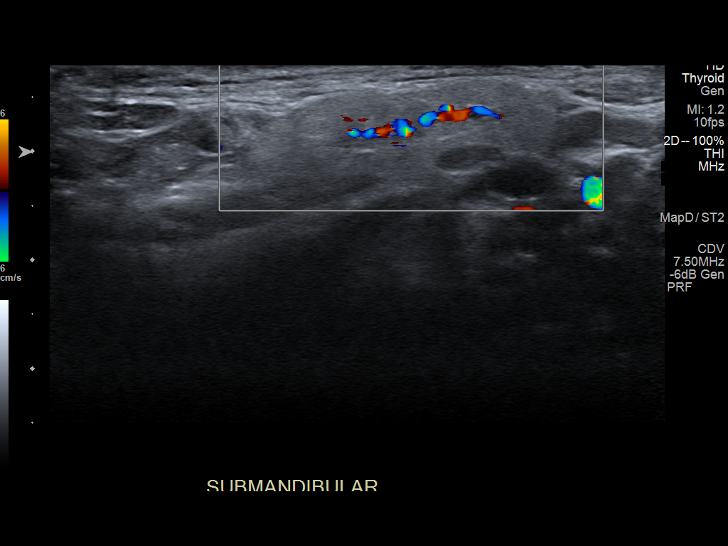
[im 11/19]
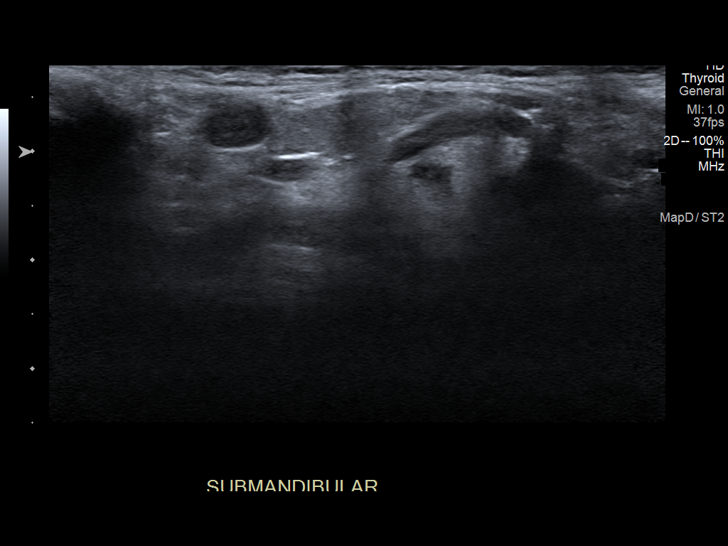
[im 13/19]
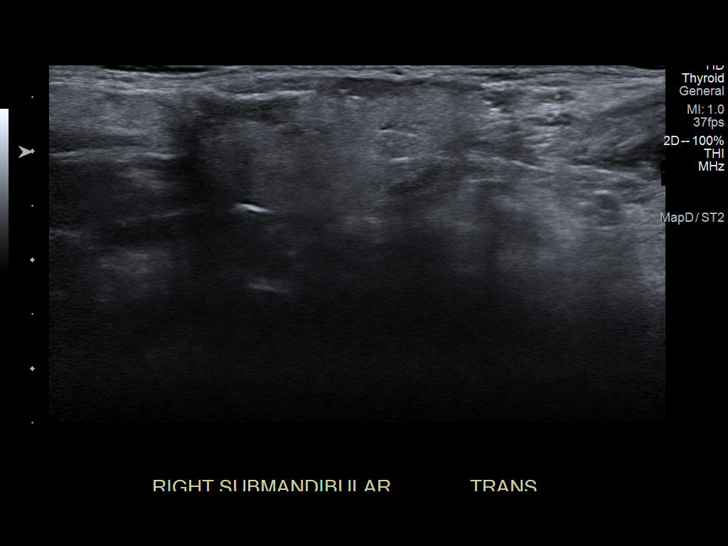
[im 14/19]
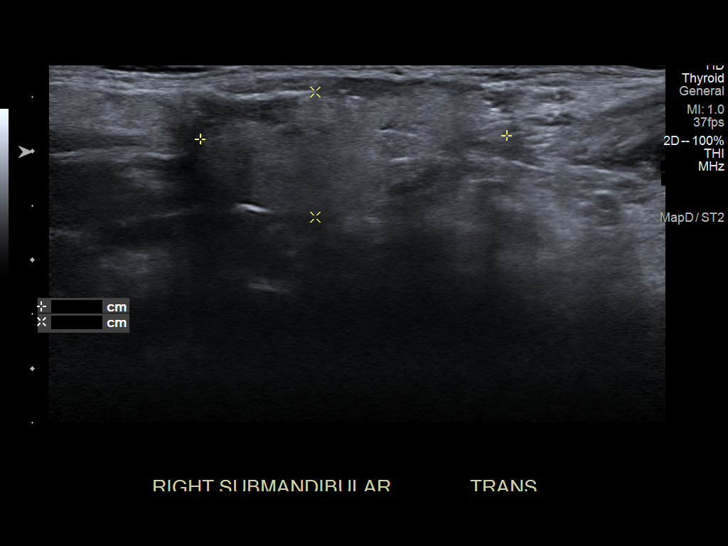
[im 16/19]
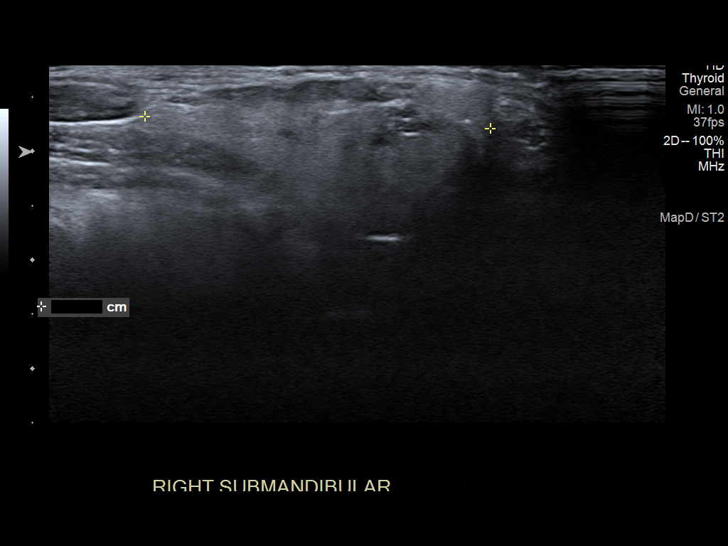
[im 17/19]
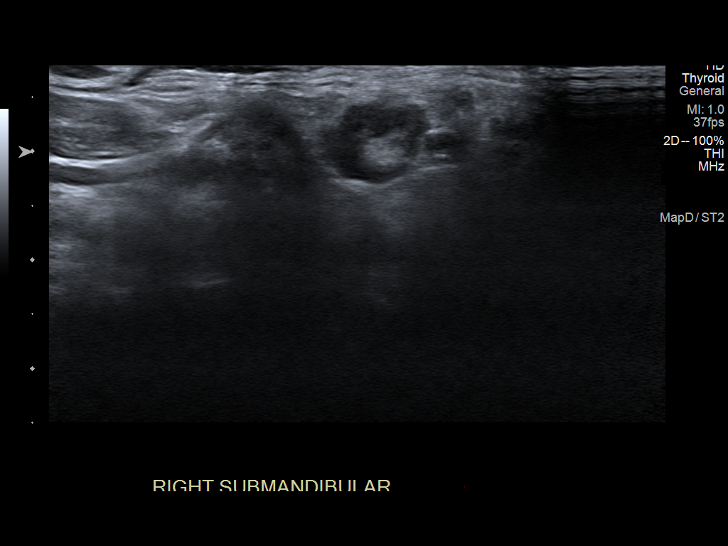
[im 19/19]
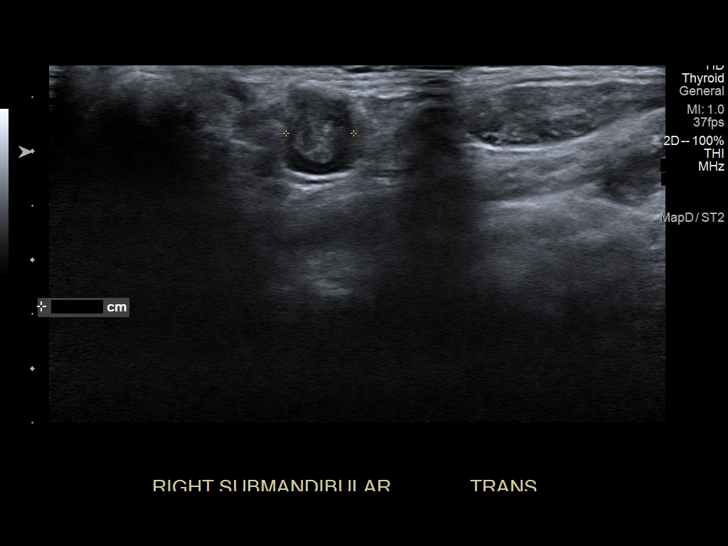

[13 of 19 positions shown; findings below may reference images not displayed]

FINDINGS: Targeted ultrasound of the bilateral submandibular glands was
performed.

The left submandibular gland measures 3.0 x 1.0 x 2.8 cm. Normal
echotexture seen throughout the glandular tissue itself. No discrete
mass or collection. No shadowing echogenic stones or abnormal ductal
dilatation. No sonographic features of acute sialoadenitis. A small
left level IB lymph node seen immediately adjacent to the gland
measures 6 mm in short axis. This node demonstrates a normal
appearance with preserved architecture and fatty hilum. No
pathologic adenopathy seen within this region.

The right submandibular gland measures 3.2 x 2.8 x 1.2 cm. Normal
echotexture seen throughout the glandular tissue itself. No discrete
mass or collection. No shadowing echogenic stones or abnormal ductal
dilatation. No sonographic features of acute sialoadenitis. Few
small right level IB nodes measure up to 7 mm in short axis. These
also demonstrate a normal appearance with preserved architecture and
fatty hilum. No pathologic adenopathy seen within this region.

Remainder of the visualized fibromuscular soft tissues are normal.
No other discrete mass or collection.
IMPRESSION: 1. Normal sonographic appearance of the submandibular glands
bilaterally. No discrete mass or collection. No visible stones or
evidence for acute sialoadenitis.
2. Few scattered normal appearing subcentimeter level Ib nodes
measuring up to 6-7 mm bilaterally. No enlarged or pathologic
adenopathy identified.
3. If there remains clinical concern for a possible underlying
occult abnormality about the left submandibular gland, further
assessment with dedicated CT of the neck with contrast could be
performed for further evaluation as warranted.

## 2023-03-07 ENCOUNTER — Other Ambulatory Visit: Payer: Self-pay | Admitting: Internal Medicine

## 2023-10-07 ENCOUNTER — Other Ambulatory Visit: Payer: Self-pay | Admitting: Internal Medicine

## 2024-01-14 ENCOUNTER — Ambulatory Visit: Admitting: Podiatry

## 2024-04-15 ENCOUNTER — Other Ambulatory Visit: Payer: Self-pay | Admitting: Internal Medicine

## 2024-04-17 NOTE — Telephone Encounter (Signed)
 Courtesy Anoro refill - pt will need an appt for further refills.

## 2024-05-12 ENCOUNTER — Other Ambulatory Visit: Payer: Self-pay | Admitting: Internal Medicine

## 2024-05-23 ENCOUNTER — Other Ambulatory Visit: Payer: Self-pay | Admitting: Internal Medicine

## 2024-05-24 NOTE — Telephone Encounter (Signed)
 Courtesy refill of Anoro. Pt must keep 05/31/24 appt with Dr. Darlean for further refills.

## 2024-05-31 ENCOUNTER — Encounter: Payer: Self-pay | Admitting: Internal Medicine

## 2024-05-31 ENCOUNTER — Ambulatory Visit: Admitting: Internal Medicine

## 2024-05-31 VITALS — BP 103/85 | HR 80 | Ht 70.0 in | Wt 159.0 lb

## 2024-05-31 DIAGNOSIS — F17210 Nicotine dependence, cigarettes, uncomplicated: Secondary | ICD-10-CM

## 2024-05-31 DIAGNOSIS — J449 Chronic obstructive pulmonary disease, unspecified: Secondary | ICD-10-CM

## 2024-05-31 NOTE — Progress Notes (Signed)
 "   Peter Harris, male    DOB: 12/10/1955   MRN: [id number]   Brief patient profile:  61 yobm  active smoker who lives in group home referred to pulmonary clinic in [place]  12/05/2020 by   Ronal Rav NP for doe x 75 ft end of driveway level onset spring 2022 though placed on spiriva and albuterol  since at least 2020 (not really sure)      History of Present Illness  12/05/2020  Pulmonary/ 1st office eval/ Peter Harris / [place] [facility]  Chief Complaint  Patient presents with   Consult    Patient reports that he has had short of breath for years and worse in last 6 months.   Dyspnea:  baseline not very active but able to walk a mall  Cough: none  Sleep: no problem on side / flat bed  SABA use: saba first thing in am / occ in pm Rec Try off spiriva and on Anoro one click first thing each am  Only use your albuterol  as a rescue medication to be used if you can't catch your breath  If doing better on samples of Anoro, ok to fill the prescription given today The key is to stop smoking completely before smoking completely stops you!   12/25/2022 6 m  f/u ov/[place] office/Peter Harris re: GOLD 3 maint on Anoro   Chief Complaint  Patient presents with   COPD    Gold 3  Dyspnea:  very sedentary / still able to take the trash out flat grade x 50 ft  Cough: none  Sleeping:  flat bed / 2 pillows no resp cc  SABA use: once a week 02: none  Rec My office will be contacting you by phone for referral to lung cancer screening program   Stop smoking     05/31/2024  f/u ov/[place] office/Peter Harris re: COPD GOLD 3   maint on Anoro   Active  smoker  Chief Complaint  Patient presents with   COPD    Shob f/u  Sometimes cough sometimes light green   Dyspnea:  gets let out at door at  [employer] and able to do complete shopping there but not bigger store  Cough: usual worse in am x 45 min to clear it  Sleeping: flat bed / 2 pillows no   resp cc  SABA use: sev times a week  02: none   Lung  cancer screening: referred again    No obvious day to day or daytime variability or assoc   mucus plugs or hemoptysis or cp or chest tightness, subjective wheeze or overt sinus or hb symptoms.    Also denies any obvious fluctuation of symptoms with weather or environmental changes or other aggravating or alleviating factors except as outlined above   No unusual exposure hx or h/o childhood pna/ asthma or knowledge of premature birth.  Current Allergies, Complete Past Medical History, Past Surgical History, Family History, and Social History were reviewed in [employer] record.  ROS  The following are not active complaints unless bolded Hoarseness, sore throat, dysphagia, dental problems, itching, sneezing,  nasal congestion or discharge of excess mucus or purulent secretions, ear ache,   fever, chills, sweats, unintended wt loss or wt gain, classically pleuritic or exertional cp,  orthopnea pnd or arm/hand swelling  or leg swelling, presyncope, palpitations, abdominal pain, anorexia, nausea, vomiting, diarrhea  or change in bowel habits or change in bladder habits, change in stools or change in urine, dysuria,  hematuria,  rash, arthralgias, visual complaints, headache, numbness, weakness or ataxia or problems with walking or coordination,  change in mood or  memory.         Outpatient Medications Prior to Visit  Medication Sig Dispense Refill   albuterol  (VENTOLIN  HFA) 108 (90 Base) MCG/ACT inhaler Inhale 1 puff into the lungs.     ANORO ELLIPTA  62.5-25 MCG/ACT AEPB INHALE ONE PUFF EVERY MORNING 60 each 0   aspirin  81 MG EC tablet Take 1 tablet by mouth daily.     bisoprolol (ZEBETA) 10 MG tablet Take 10 mg by mouth daily.     blood glucose meter kit and supplies KIT Dispense based on patient and insurance preference. Use up to four times daily as directed. 1 each 0   HUMULIN R  100 UNIT/ML injection      hydrALAZINE  (APRESOLINE ) 10 MG tablet Take 20 mg by mouth 2 (two)  times daily.     hydrOXYzine  (ATARAX ) 25 MG tablet Take 25 mg by mouth 2 (two) times daily.     insulin  glargine (LANTUS ) 100 UNIT/ML Solostar Pen Inject 20 Units into the skin daily. 15 mL 11   Insulin  Pen Needle 32G X 4 MM MISC Use as directed. 90 each 3   loratadine (CLARITIN) 10 MG tablet Take 1 tablet by mouth daily.     lovastatin (MEVACOR) 40 MG tablet Take 40 mg by mouth at bedtime.     Multiple Vitamin (MULTI-VITAMIN) tablet Take 1 tablet by mouth daily.     OLANZapine  (ZYPREXA ) 20 MG tablet Take 20 mg by mouth daily.     omeprazole (PRILOSEC) 20 MG capsule Take 20 mg by mouth daily.     psyllium (METAMUCIL) 58.6 % packet Take 1 packet by mouth 3 (three) times daily. With 8 oz of water     sertraline  (ZOLOFT ) 25 MG tablet Take 25 mg by mouth daily.     triamterene -hydrochlorothiazide  (MAXZIDE ) 75-50 MG tablet Take 1 tablet by mouth daily.     No facility-administered medications prior to visit.             Objective:    wts   05/31/2024       159  12/25/2022         148  06/18/2022         142  11/21/2021         141   04/15/21 136 lb 1.9 oz (61.7 kg)  12/05/20 136 lb (61.7 kg)     Vital signs reviewed  05/31/2024  - Note at rest 02 sats  95% on RA   General appearance:    somewhat child like affect/ red beanie /slt hoarse    HEENT : Oropharynx  clear  / edentulous    NECK :  without  apparent JVD/ palpable Nodes/TM    LUNGS: no acc muscle use,  Mild barrel  contour chest wall with bilateral  Distant bs s audible wheeze and  without cough on insp or exp maneuvers  and mild  Hyperresonant  to  percussion bilaterally     CV:  RRR  no s3 or murmur or increase in P2, and no edema   ABD:  soft and nontender   MS:  Nl gait/ ext warm without deformities Or obvious joint restrictions  calf tenderness, cyanosis or clubbing     SKIN: warm and dry without lesions    NEURO:  alert, approp, nl sensorium with  no motor or cerebellar deficits apparent.  Assessment     Assessment & Plan COPD GOLD 3 / still smoking  Onset doe  prior to 2020 with emphysema suggested on cxr 12/05/20  -  12/05/2020   Walked RA  fast pace for 3 laps without stopping. He did not complain of shortness of breath or trouble breathing during the walk with sats 98% at end. - 12/05/2020  After extensive coaching inhaler device,  effectiveness =    90%  DPI  > change spiriva to anoro daily  - PFT's   03/25/21  FEV1 1.30 (36 % ) ratio 0.53  p 0 % improvement from saba p Anoro  prior to study with DLCO  6.27 (22%) corrects to 1.30 (31%)  for alv volume and FV curve classically concave   - 11/21/2021   Walked on RA   x  2.5   lap(s) =  approx 375  ft  @ moderately  fast pace, stopped due to sob  with lowest 02 sats 96% - 11/26/21  LDSCT Mild diffuse bronchial wall thickening with mild centrilobularand paraseptal emphysema - 06/18/2022   Walked on RA  x  3  lap(s) =  approx 450  ft  @ mod pace, stopped due to end of study s sob  with lowest 02 sats 95%    Pt is Group B in terms of symptom/risk and laba/lama therefore appropriate rx at this point >>>  continue anoro and approp saba    Each maintenance medication was reviewed in detail including emphasizing most importantly the difference between maintenance and prns and under what circumstances the prns are to be triggered using an action plan format where appropriate.  Total time for H and P, chart review, counseling, reviewing hfa/ dpi/elipta device(s) and generating customized AVS unique to this office visit / same day charting = 25 min           Cigarette smoker LDSCT  11/26/21   RADS 2 > f/u one year> referred again 05/31/2024    5  min discussion re active cigarette smoking in addition to office E&M  Ask about tobacco use:   ongoing  Advise quitting  I took an extended  opportunity with this patient to outline the consequences of continued cigarette use  in airway disorders based on all the data we have from the multiple  national lung health studies (perfomed over decades at millions of dollars in cost)  indicating that smoking cessation, not choice of inhalers or pulmonary physicians, is the most important aspect of his  care.   Assess willingness:  Not committed at this point Assist in quit attempt:  Per PCP when ready Arrange follow up:   Follow up per Primary Care planned     AVS  Patient Instructions  My office will be contacting you by phone for referral to lung cancer screening   [phone])   - if you don't hear back from my office within one week,  please call us  back or notify us  thru MyChart and we'll address it right away.  Or  Call the lung cancer screening program directly @  [phone] to discuss scheduling your low dose CT scan as soon as possible so you stay up to date.   The key is to stop smoking completely before smoking completely stops you!  Please schedule a follow up visit in 6 months but call sooner if needed         Ozell America, MD 06/01/2024                                     "

## 2024-05-31 NOTE — Patient Instructions (Addendum)
 My office will be contacting you by phone for referral to lung cancer screening   [phone])   - if you don't hear back from my office within one week,  please call us  back or notify us  thru MyChart and we'll address it right away.  Or  Call the lung cancer screening program directly @  [phone] to discuss scheduling your low dose CT scan as soon as possible so you stay up to date.   The key is to stop smoking completely before smoking completely stops you!  Please schedule a follow up visit in 6 months but call sooner if needed

## 2024-06-01 NOTE — Assessment & Plan Note (Addendum)
 LDSCT  11/26/21   RADS 2 > f/u one year> referred again 05/31/2024    5  min discussion re active cigarette smoking in addition to office E&M  Ask about tobacco use:   ongoing  Advise quitting  I took an extended  opportunity with this patient to outline the consequences of continued cigarette use  in airway disorders based on all the data we have from the multiple national lung health studies (perfomed over decades at millions of dollars in cost)  indicating that smoking cessation, not choice of inhalers or pulmonary physicians, is the most important aspect of his  care.   Assess willingness:  Not committed at this point Assist in quit attempt:  Per PCP when ready Arrange follow up:   Follow up per Primary Care planned

## 2024-06-01 NOTE — Assessment & Plan Note (Addendum)
 Onset doe  prior to 2020 with emphysema suggested on cxr 12/05/20  -  12/05/2020   Walked RA  fast pace for 3 laps without stopping. He did not complain of shortness of breath or trouble breathing during the walk with sats 98% at end. - 12/05/2020  After extensive coaching inhaler device,  effectiveness =    90%  DPI  > change spiriva to anoro daily  - PFT's   03/25/21  FEV1 1.30 (36 % ) ratio 0.53  p 0 % improvement from saba p Anoro  prior to study with DLCO  6.27 (22%) corrects to 1.30 (31%)  for alv volume and FV curve classically concave   - 11/21/2021   Walked on RA   x  2.5   lap(s) =  approx 375  ft  @ moderately  fast pace, stopped due to sob  with lowest 02 sats 96% - 11/26/21  LDSCT Mild diffuse bronchial wall thickening with mild centrilobularand paraseptal emphysema - 06/18/2022   Walked on RA  x  3  lap(s) =  approx 450  ft  @ mod pace, stopped due to end of study s sob  with lowest 02 sats 95%    Pt is Group B in terms of symptom/risk and laba/lama therefore appropriate rx at this point >>>  continue anoro and approp saba    Each maintenance medication was reviewed in detail including emphasizing most importantly the difference between maintenance and prns and under what circumstances the prns are to be triggered using an action plan format where appropriate.  Total time for H and P, chart review, counseling, reviewing hfa/ dpi/elipta device(s) and generating customized AVS unique to this office visit / same day charting = 25 min

## 2024-06-07 ENCOUNTER — Telehealth: Payer: Self-pay

## 2024-06-07 DIAGNOSIS — Z87891 Personal history of nicotine dependence: Secondary | ICD-10-CM

## 2024-06-07 DIAGNOSIS — F17210 Nicotine dependence, cigarettes, uncomplicated: Secondary | ICD-10-CM

## 2024-06-07 DIAGNOSIS — Z122 Encounter for screening for malignant neoplasm of respiratory organs: Secondary | ICD-10-CM

## 2024-06-07 NOTE — Telephone Encounter (Signed)
 Lung Cancer Screening Narrative/Criteria Questionnaire (Cigarette Smokers Only- No Cigars/Pipes/vapes)   Peter Harris   SDMV:06/15/2024 10:30 am Natalie        Sep 30, 1955               LDCT: 07/12/2024 at 10:30 am AP    69 y.o.   Phone: [phone]  Lung Screening Narrative (confirm age 66-77 yrs Medicare / 50-80 yrs Private pay insurance)   Insurance information: Medicare/Medicaid   Referring Provider: Darlean   This screening involves an initial phone call with a team member from our program. It is called a shared decision making visit. The initial meeting is required by  insurance and Medicare to make sure you understand the program. This appointment takes about 15-20 minutes to complete. You will complete the screening scan at your scheduled date/time.  This scan takes about 5-10 minutes to complete. You can eat and drink normally before and after the scan.  Criteria questions for Lung Cancer Screening:   Are you a current or former smoker? Current Age began smoking: 11   If you are a former smoker, what year did you quit smoking? Never quit (within 15 yrs)   To calculate your smoking history, I need an accurate estimate of how many packs of cigarettes you smoked per day and for how many years. (Not just the number of PPD you are now smoking)   Years smoking 57 x Packs per day 1 = Pack years 1   (at least 20 pack yrs)   (Make sure they understand that we need to know how much they have smoked in the past, not just the number of PPD they are smoking now)  Do you have a personal history of cancer?  No    Do you have a family history of cancer? No  Are you coughing up blood?  No  Have you had unexplained weight loss of 15 lbs or more in the last 6 months? No  It looks like you meet all criteria.  When would be a good time for us  to schedule you for this screening?   Additional information: N/A

## 2024-06-09 ENCOUNTER — Other Ambulatory Visit: Payer: Self-pay | Admitting: Internal Medicine

## 2024-06-15 ENCOUNTER — Encounter: Payer: Self-pay | Admitting: *Deleted

## 2024-06-15 ENCOUNTER — Ambulatory Visit: Admitting: *Deleted

## 2024-06-15 DIAGNOSIS — F17210 Nicotine dependence, cigarettes, uncomplicated: Secondary | ICD-10-CM | POA: Diagnosis not present

## 2024-06-15 NOTE — Progress Notes (Signed)
 Virtual Visit via Telephone Note  I connected with Peter Harris on 06/15/24 at 10:30 AM EST by telephone and verified that I am speaking with the correct person using two identifiers.  Location: Patient: at home Provider: [address], [place], [place], [address]    I discussed the limitations, risks, security and privacy concerns of performing an evaluation and management service by telephone and the availability of in person appointments. I also discussed with the patient that there may be a patient responsible charge related to this service. The patient expressed understanding and agreed to proceed.     Shared Decision Making Visit Lung Cancer Screening Program [phone])   Eligibility: Age 69 y.o. Pack Years Smoking History Calculation 54 (# packs/per year x # years smoked) Recent History of coughing up blood  no Unexplained weight loss? no ( >Than 15 pounds within the last 6 months ) Prior History Lung / other cancer no (Diagnosis within the last 5 years already requiring surveillance chest CT Scans). Smoking Status Current Smoker Former Smokers: Years since quit: n/a  Quit Date: n/a  Visit Components: Discussion included one or more decision making aids. yes Discussion included risk/benefits of screening. yes Discussion included potential follow up diagnostic testing for abnormal scans. yes Discussion included meaning and risk of over diagnosis. yes Discussion included meaning and risk of False Positives. yes Discussion included meaning of total radiation exposure. yes  Counseling Included: Importance of adherence to annual lung cancer LDCT screening. yes Impact of comorbidities on ability to participate in the program. yes Ability and willingness to under diagnostic treatment. yes  Smoking Cessation Counseling: Current Smokers:  Discussed importance of smoking cessation. yes Information about tobacco cessation classes and interventions provided to patient.  yes Patient provided with ticket for LDCT Scan. no Symptomatic Patient. no  Counseling(Intermediate counseling: > three minutes) [address]406 Diagnosis Code: Tobacco Use Z72.0 Asymptomatic Patient yes  Smoking/Tobacco Cessation Counseling Peter Harris is a current user of tobacco or nicotine products. He is considering quitting at this time. Counseling provided today addressed the risks of continued use and the benefits of cessation. Discussed tobacco/nicotine use history, readiness to quit, and evidence-based treatment options including behavioral strategies, support resources, and pharmacologic therapies. Provided encouragement and educational materials on steps and resources to quit smoking. Patient questions were addressed, and follow-up recommended for continued support. Total time spent on counseling: 4 minutes.   Former Smokers:  Discussed the importance of maintaining cigarette abstinence. yes Diagnosis Code: Personal History of Nicotine Dependence. S12.108 Information about tobacco cessation classes and interventions provided to patient. Yes Patient provided with ticket for LDCT Scan. no Written Order for Lung Cancer Screening with LDCT placed in Epic. Yes (CT Chest Lung Cancer Screening Low Dose W/O CM) [id number] Z12.2-Screening of respiratory organs Z87.891-Personal history of nicotine dependence   Laneta Speaks, RN

## 2024-06-15 NOTE — Patient Instructions (Signed)

## 2024-07-12 ENCOUNTER — Ambulatory Visit (HOSPITAL_COMMUNITY)
